# Patient Record
Sex: Female | Born: 1980 | Race: White | Hispanic: No | Smoking: Former smoker
Health system: Southern US, Community
[De-identification: ages and names within clinical notes are randomized; demographics above are authoritative.]

## PROBLEM LIST (undated history)

## (undated) DIAGNOSIS — L409 Psoriasis, unspecified: Secondary | ICD-10-CM

## (undated) DIAGNOSIS — M1990 Unspecified osteoarthritis, unspecified site: Secondary | ICD-10-CM

## (undated) DIAGNOSIS — G43909 Migraine, unspecified, not intractable, without status migrainosus: Secondary | ICD-10-CM

## (undated) HISTORY — PX: HERNIA REPAIR: SHX51

## (undated) HISTORY — PX: TUBAL LIGATION: SHX77

---
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
## 2000-04-22 ENCOUNTER — Inpatient Hospital Stay (HOSPITAL_COMMUNITY): Admission: AD | Admit: 2000-04-22 | Discharge: 2000-04-22 | Payer: Self-pay | Admitting: Obstetrics & Gynecology

## 2002-01-03 ENCOUNTER — Ambulatory Visit (HOSPITAL_COMMUNITY): Admission: EM | Admit: 2002-01-03 | Discharge: 2002-01-03 | Payer: Self-pay | Admitting: *Deleted

## 2004-07-26 ENCOUNTER — Emergency Department (HOSPITAL_COMMUNITY): Admission: EM | Admit: 2004-07-26 | Discharge: 2004-07-26 | Payer: Self-pay | Admitting: Emergency Medicine

## 2006-11-25 ENCOUNTER — Inpatient Hospital Stay (HOSPITAL_COMMUNITY): Admission: AD | Admit: 2006-11-25 | Discharge: 2006-11-28 | Payer: Self-pay | Admitting: Obstetrics & Gynecology

## 2007-02-10 ENCOUNTER — Ambulatory Visit (HOSPITAL_COMMUNITY): Admission: RE | Admit: 2007-02-10 | Discharge: 2007-02-10 | Payer: Self-pay | Admitting: Obstetrics and Gynecology

## 2008-11-04 ENCOUNTER — Inpatient Hospital Stay (HOSPITAL_COMMUNITY): Admission: AD | Admit: 2008-11-04 | Discharge: 2008-11-04 | Payer: Self-pay | Admitting: Obstetrics & Gynecology

## 2008-12-19 ENCOUNTER — Ambulatory Visit: Payer: Self-pay | Admitting: Obstetrics and Gynecology

## 2008-12-19 ENCOUNTER — Inpatient Hospital Stay (HOSPITAL_COMMUNITY): Admission: AD | Admit: 2008-12-19 | Discharge: 2008-12-19 | Payer: Self-pay | Admitting: Obstetrics and Gynecology

## 2010-04-29 LAB — URINALYSIS, ROUTINE W REFLEX MICROSCOPIC
Bilirubin Urine: NEGATIVE
Glucose, UA: NEGATIVE mg/dL
Ketones, ur: 15 mg/dL — AB
Nitrite: POSITIVE — AB
Protein, ur: NEGATIVE mg/dL
Specific Gravity, Urine: 1.025 (ref 1.005–1.030)
Urobilinogen, UA: 0.2 mg/dL (ref 0.0–1.0)
pH: 5 (ref 5.0–8.0)

## 2010-04-29 LAB — URINE CULTURE: Colony Count: >=100000

## 2010-04-29 LAB — URINE MICROSCOPIC-ADD ON

## 2010-04-29 LAB — CREATININE, SERUM
Creatinine, Ser: 0.97 mg/dL (ref 0.4–1.2)
GFR calc Af Amer: >60 mL/min (ref >60)
GFR calc non Af Amer: >60 mL/min (ref >60)

## 2010-04-30 LAB — WET PREP, GENITAL: Yeast Wet Prep HPF POC: NONE SEEN

## 2010-04-30 LAB — URINALYSIS, ROUTINE W REFLEX MICROSCOPIC
Bilirubin Urine: NEGATIVE
Glucose, UA: NEGATIVE mg/dL
Protein, ur: NEGATIVE mg/dL

## 2010-04-30 LAB — URINE MICROSCOPIC-ADD ON

## 2010-04-30 LAB — POCT PREGNANCY, URINE: Preg Test, Ur: NEGATIVE

## 2010-06-09 NOTE — Op Note (Signed)
Kristin Norman, Kristin Norman                ACCOUNT NO.:  [id number]   MEDICAL RECORD NO.:  [id number]          PATIENT TYPE:  AMB   LOCATION:  SDC                           FACILITY:  [facility]   PHYSICIAN:  Zelphia Cairo, MD    DATE OF BIRTH:  [birth year]   DATE OF PROCEDURE:  02/10/2007  DATE OF DISCHARGE:                               OPERATIVE REPORT   PREOPERATIVE DIAGNOSES:  1. Multiparous.  2. Desires permanent sterilization.   POSTOPERATIVE DIAGNOSES:  1. Multiparous.  2. Desires permanent sterilization.   PROCEDURE:  Laparoscopic bilateral tubal ligation with Filshie clips.   SURGEON:  Renaldo Fiddler.   ANESTHESIA:  General.   COMPLICATIONS:  None.   CONDITION:  Stable and extubated to recovery room.   PROCEDURE:  The patient was taken to the operating room where general  anesthesia was found to be adequate.  She was placed in the dorsal  lithotomy position using Allen stirrups.  She was prepped and draped in  sterile fashion and a catheter was used to drain her bladder for  approximately 50 mL of clear urine.  A bivalve speculum was placed in  the vagina, and a single-tooth tenaculum placed on the anterior lip of  the cervix.  A Hulka clamp was placed through the cervix and on the  anterior lip.  The single-tooth tenaculum and speculum were then removed  from the vagina and our attention was turned to the abdomen.   A small infraumbilical skin incision was made with a scalpel, and an 11-  mm optical trocar was inserted through this incision under direct  visualization.  Once intraperitoneal placement was confirmed, CO2 was  turned on and the abdomen and pelvis were insufflated.  A survey was  performed.  Bilateral fallopian tubes, ovaries and uterus appeared  normal.  There was a mild amount of scar tissue in the right upper  quadrant above the liver.  Otherwise, the abdomen and pelvis appeared  normal.  Filshie clips were loaded, and bilateral fallopian tubes were  obstructed  using Filshie clips.  There were no complications.  Laparoscope and trocar was then removed from the abdomen, and the  fascia of the infraumbilical skin incision was closed with Vicryl.  The  skin was closed with 3-0 Vicryl.  The patient tolerated the procedure  well.  Marcaine was then used to provide local anesthesia.  The patient  tolerated the procedure well.  Sponge, lap, needle and instrument counts  were correct x2      Zelphia Cairo, MD  Electronically Signed     GA/MEDQ  D:  02/10/2007  T:  02/10/2007  Job:  [id number]

## 2010-10-16 LAB — CBC
HCT: 41.8
MCV: 87.2
RBC: 4.79
WBC: 11.5 — ABNORMAL HIGH

## 2010-11-03 LAB — CBC
HCT: 38.5
Hemoglobin: 13.3
MCHC: 34.5
MCHC: 34.6
MCV: 89.1
Platelets: 239
RDW: 13.2

## 2013-01-26 ENCOUNTER — Emergency Department (HOSPITAL_COMMUNITY)
Admission: EM | Admit: 2013-01-26 | Discharge: 2013-01-26 | Disposition: A | Discharge status: Home / Self Care | Payer: Medicaid Other | Attending: Emergency Medicine | Admitting: Emergency Medicine

## 2013-01-26 ENCOUNTER — Encounter (HOSPITAL_COMMUNITY): Payer: Self-pay | Admitting: Emergency Medicine

## 2013-01-26 ENCOUNTER — Emergency Department (HOSPITAL_COMMUNITY): Payer: Medicaid Other

## 2013-01-26 DIAGNOSIS — IMO0002 Reserved for concepts with insufficient information to code with codable children: Secondary | ICD-10-CM | POA: Insufficient documentation

## 2013-01-26 DIAGNOSIS — K259 Gastric ulcer, unspecified as acute or chronic, without hemorrhage or perforation: Secondary | ICD-10-CM | POA: Insufficient documentation

## 2013-01-26 DIAGNOSIS — R059 Cough, unspecified: Secondary | ICD-10-CM

## 2013-01-26 DIAGNOSIS — R29898 Other symptoms and signs involving the musculoskeletal system: Secondary | ICD-10-CM | POA: Insufficient documentation

## 2013-01-26 DIAGNOSIS — Z79899 Other long term (current) drug therapy: Secondary | ICD-10-CM | POA: Insufficient documentation

## 2013-01-26 DIAGNOSIS — F17200 Nicotine dependence, unspecified, uncomplicated: Secondary | ICD-10-CM | POA: Insufficient documentation

## 2013-01-26 DIAGNOSIS — J029 Acute pharyngitis, unspecified: Secondary | ICD-10-CM | POA: Insufficient documentation

## 2013-01-26 DIAGNOSIS — B9789 Other viral agents as the cause of diseases classified elsewhere: Secondary | ICD-10-CM | POA: Insufficient documentation

## 2013-01-26 DIAGNOSIS — R05 Cough: Secondary | ICD-10-CM

## 2013-01-26 DIAGNOSIS — B349 Viral infection, unspecified: Secondary | ICD-10-CM

## 2013-01-26 HISTORY — DX: Unspecified osteoarthritis, unspecified site: M19.90

## 2013-01-26 MED ORDER — HYDROCOD POLST-CHLORPHEN POLST 10-8 MG/5ML PO LQCR: 5.0000 mL | Freq: Every evening | ORAL | Status: DC | PRN

## 2013-01-26 NOTE — ED Notes (Signed)
C/o chest congestion worse when she coughs

## 2013-01-26 NOTE — ED Provider Notes (Signed)
CSN: [id number]     Arrival date & time 01/26/13  1827 History   First MD Initiated Contact with Patient 01/26/13 2257     Chief Complaint  Patient presents with  . multiple complaints    (Consider location/radiation/quality/duration/timing/severity/associated sxs/prior Treatment) HPI 33 year old female presents to emergency room with complaint of 2 days of cough.  No fevers.  No sick contacts.  Patient is a 20 year smoker, recently switched to vapor cigarettes.  She reports the cough is raspy, productive of dark brown to black sputum.  She has sore throat.  Due to 2 persistent cough.  She's been taking Mucinex with some improvement.  She's tried hot tea with honey.  Patient reports she has problems with stomach ulcers, and has been avoiding ibuprofen, but has been taking Tylenol. Past Medical History  Diagnosis Date  . Arthritis    History reviewed. No pertinent past surgical history. No family history on file. History  Substance Use Topics  . Smoking status: Current Every Day Smoker  . Smokeless tobacco: Not on file  . Alcohol Use: Yes   OB History   Grav Para Term Preterm Abortions TAB SAB Ect Mult Living                 Review of Systems  See History of Present Illness; otherwise all other systems are reviewed and negative Allergies  Eggs or egg-derived products; Sulfa antibiotics; Tamiflu; and Tomato  Home Medications   Current Outpatient Rx  Name  Route  Sig  Dispense  Refill  . clonazePAM (KLONOPIN) 0.5 MG tablet   Oral   Take 0.5 mg by mouth at bedtime as needed (migraine).         . fluocinonide (LIDEX) 0.05 % external solution   Topical   Apply 1 application topically daily as needed (itchy scalp). About once a week         . fluocinonide cream (LIDEX) 0.05 %   Topical   Apply 1 application topically 3 (three) times daily.         Marland Kitchen FOLIC ACID PO   Oral   Take 1 mg by mouth at bedtime. 2 500 mcg tablets         . guaiFENesin (MUCINEX) 600 MG 12 hr  tablet   Oral   Take 600 mg by mouth every 8 (eight) hours as needed for cough (congestion).         Marland Kitchen ibuprofen (ADVIL,MOTRIN) 200 MG tablet   Oral   Take 800 mg by mouth every 4 (four) hours as needed (pain).         . MedroxyPROGESTERone Acetate (DEPO-PROVERA IM)   Intramuscular   Inject 1 application into the muscle every 3 (three) months. Last injection mid October, 2014         . methotrexate (RHEUMATREX) 2.5 MG tablet   Oral   Take 12.5 mg by mouth once a week. Caution:Chemotherapy. Protect from light. Take on Thursday evening at 8pm         . PRESCRIPTION MEDICATION   Topical   Apply 1 application topically 3 (three) times daily. Cream for psoriasis         . tiZANidine (ZANAFLEX) 4 MG tablet   Oral   Take 4 mg by mouth every 6 (six) hours as needed (migraines).          BP 106/64  Pulse 70  Temp(Src) 97.8 F (36.6 C) (Oral)  Resp 16  Ht 5\' 5"  (1.651 m)  Wt 123 lb 8 oz (56.019 kg)  BMI 20.55 kg/m2  SpO2 99% Physical Exam  Nursing note and vitals reviewed. Constitutional: She is oriented to person, place, and time. She appears well-developed and well-nourished.  HENT:  Head: Normocephalic and atraumatic.  Nose: Nose normal.  Mouth/Throat: Oropharynx is clear and moist.  Eyes: Conjunctivae and EOM are normal. Pupils are equal, round, and reactive to light.  Neck: Normal range of motion. Neck supple. No JVD present. No tracheal deviation present. No thyromegaly present.  Cardiovascular: Normal rate, regular rhythm, normal heart sounds and intact distal pulses.  Exam reveals no gallop and no friction rub.   No murmur heard. Pulmonary/Chest: Effort normal and breath sounds normal. No stridor. No respiratory distress. She has no wheezes. She has no rales. She exhibits no tenderness.  Cough noted  Abdominal: Soft. Bowel sounds are normal. She exhibits no distension and no mass. There is no tenderness. There is no rebound and no guarding.  Musculoskeletal:  Normal range of motion. She exhibits no edema and no tenderness.  Lymphadenopathy:    She has no cervical adenopathy.  Neurological: She is alert and oriented to person, place, and time. She exhibits normal muscle tone. Coordination normal.  Skin: Skin is warm and dry. No rash noted. No erythema. No pallor.  Psychiatric: She has a normal mood and affect. Her behavior is normal. Judgment and thought content normal.    ED Course  Procedures (including critical care time) Labs Review Labs Reviewed - No data to display Imaging Review Dg Chest 2 View  01/26/2013   CLINICAL DATA:  Chest pain and cough.  EXAM: CHEST  2 VIEW  COMPARISON:  None.  FINDINGS: The heart size and mediastinal contours are within normal limits. Both lungs are clear. The visualized skeletal structures are unremarkable.  IMPRESSION: No active cardiopulmonary disease.   Electronically Signed   By: Loralie ChampagneMark  Gallerani M.D.   On: 01/26/2013 20:31    EKG Interpretation   None       MDM   1. Cough   2. Viral infection    33 year old female with 2 days of cough.  Appears to be upper respiratory viral infection/early bronchitis.  Chest x-ray is negative.  Patient advised supportive care only at this time.  We'll prescribe Tussionex for nighttime use.   Olivia Mackielga M Miray Mancino, MD 01/26/13 [phone]

## 2013-01-26 NOTE — ED Notes (Signed)
The pt is c/o a cold sore throat coughing productive cough.  Mild headache she is also c/o body aches and back pain since yesterday.  lmp none depo shots tubal ligation

## 2013-01-26 NOTE — Discharge Instructions (Signed)
Antibiotic Nonuse  Your caregiver felt that the infection or problem was not one that would be helped with an antibiotic. Infections may be caused by viruses or bacteria. Only a caregiver can tell which one of these is the likely cause of an illness. A cold is the most common cause of infection in both adults and children. A cold is a virus. Antibiotic treatment will have no effect on a viral infection. Viruses can lead to many lost days of work caring for sick children and many missed days of school. Children may catch as many as 10 "colds" or "flus" per year during which they can be tearful, cranky, and uncomfortable. The goal of treating a virus is aimed at keeping the ill person comfortable. Antibiotics are medications used to help the body fight bacterial infections. There are relatively few types of bacteria that cause infections but there are hundreds of viruses. While both viruses and bacteria cause infection they are very different types of germs. A viral infection will typically go away by itself within 7 to 10 days. Bacterial infections may spread or get worse without antibiotic treatment. Examples of bacterial infections are:  Sore throats (like strep throat or tonsillitis).  Infection in the lung (pneumonia).  Ear and skin infections. Examples of viral infections are:  Colds or flus.  Most coughs and bronchitis.  Sore throats not caused by Strep.  Runny noses. It is often best not to take an antibiotic when a viral infection is the cause of the problem. Antibiotics can kill off the helpful bacteria that we have inside our body and allow harmful bacteria to start growing. Antibiotics can cause side effects such as allergies, nausea, and diarrhea without helping to improve the symptoms of the viral infection. Additionally, repeated uses of antibiotics can cause bacteria inside of our body to become resistant. That resistance can be passed onto harmful bacterial. The next time you have  an infection it may be harder to treat if antibiotics are used when they are not needed. Not treating with antibiotics allows our own immune system to develop and take care of infections more efficiently. Also, antibiotics will work better for us when they are prescribed for bacterial infections. Treatments for a child that is ill may include:  Give extra fluids throughout the day to stay hydrated.  Get plenty of rest.  Only give your child over-the-counter or prescription medicines for pain, discomfort, or fever as directed by your caregiver.  The use of a cool mist humidifier may help stuffy noses.  Cold medications if suggested by your caregiver. Your caregiver may decide to start you on an antibiotic if:  The problem you were seen for today continues for a longer length of time than expected.  You develop a secondary bacterial infection. SEEK MEDICAL CARE IF:  Fever lasts longer than 5 days.  Symptoms continue to get worse after 5 to 7 days or become severe.  Difficulty in breathing develops.  Signs of dehydration develop (poor drinking, rare urinating, dark colored urine).  Changes in behavior or worsening tiredness (listlessness or lethargy). Document Released: 03/22/2001 Document Revised: 04/05/2011 Document Reviewed: 09/18/2008 [facility]Exit[facility] Patient Information 2014 [place], [place].  Cool Mist Vaporizers Vaporizers may help relieve the symptoms of a cough and cold. They add moisture to the air, which helps mucus to become thinner and less sticky. This makes it easier to breathe and cough up secretions. Cool mist vaporizers do not cause serious burns like hot mist vaporizers ("steamers, humidifiers"). Vaporizers have  not been proved to show they help with colds. You should not use a vaporizer if you are allergic to mold.  HOME CARE INSTRUCTIONS  Follow the package instructions for the vaporizer.  Do not use anything other than distilled water in the vaporizer.  Do not run the  vaporizer all of the time. This can cause mold or bacteria to grow in the vaporizer.  Clean the vaporizer after each time it is used.  Clean and dry the vaporizer well before storing it.  Stop using the vaporizer if worsening respiratory symptoms develop. Document Released: 10/09/2003 Document Revised: 09/13/2012 Document Reviewed: 05/31/2012 [facility] Patient Information 2014 [place], [place].  Cough, Adult  A cough is a reflex that helps clear your throat and airways. It can help heal the body or may be a reaction to an irritated airway. A cough may only last 2 or 3 weeks (acute) or may last more than 8 weeks (chronic).  CAUSES Acute cough:  Viral or bacterial infections. Chronic cough:  Infections.  Allergies.  Asthma.  Post-nasal drip.  Smoking.  Heartburn or acid reflux.  Some medicines.  Chronic lung problems (COPD).  Cancer. SYMPTOMS   Cough.  Fever.  Chest pain.  Increased breathing rate.  High-pitched whistling sound when breathing (wheezing).  Colored mucus that you cough up (sputum). TREATMENT   A bacterial cough may be treated with antibiotic medicine.  A viral cough must run its course and will not respond to antibiotics.  Your caregiver may recommend other treatments if you have a chronic cough. HOME CARE INSTRUCTIONS   Only take over-the-counter or prescription medicines for pain, discomfort, or fever as directed by your caregiver. Use cough suppressants only as directed by your caregiver.  Use a cold steam vaporizer or humidifier in your bedroom or home to help loosen secretions.  Sleep in a semi-upright position if your cough is worse at night.  Rest as needed.  Stop smoking if you smoke. SEEK IMMEDIATE MEDICAL CARE IF:   You have pus in your sputum.  Your cough starts to worsen.  You cannot control your cough with suppressants and are losing sleep.  You begin coughing up blood.  You have difficulty breathing.  You develop  pain which is getting worse or is uncontrolled with medicine.  You have a fever. MAKE SURE YOU:   Understand these instructions.  Will watch your condition.  Will get help right away if you are not doing well or get worse. Document Released: 07/10/2010 Document Revised: 04/05/2011 Document Reviewed: 07/10/2010 [facility] Patient Information 2014 [place], [place].  Viral Infections A viral infection can be caused by different types of viruses.Most viral infections are not serious and resolve on their own. However, some infections may cause severe symptoms and may lead to further complications. SYMPTOMS Viruses can frequently cause:  Minor sore throat.  Aches and pains.  Headaches.  Runny nose.  Different types of rashes.  Watery eyes.  Tiredness.  Cough.  Loss of appetite.  Gastrointestinal infections, resulting in nausea, vomiting, and diarrhea. These symptoms do not respond to antibiotics because the infection is not caused by bacteria. However, you might catch a bacterial infection following the viral infection. This is sometimes called a "superinfection." Symptoms of such a bacterial infection may include:  Worsening sore throat with pus and difficulty swallowing.  Swollen neck glands.  Chills and a high or persistent fever.  Severe headache.  Tenderness over the sinuses.  Persistent overall ill feeling (malaise), muscle aches, and tiredness (fatigue).  Persistent cough.  Yellow, green, or brown mucus production with coughing. HOME CARE INSTRUCTIONS   Only take over-the-counter or prescription medicines for pain, discomfort, diarrhea, or fever as directed by your caregiver.  Drink enough water and fluids to keep your urine clear or pale yellow. Sports drinks can provide valuable electrolytes, sugars, and hydration.  Get plenty of rest and maintain proper nutrition. Soups and broths with crackers or rice are fine. SEEK IMMEDIATE MEDICAL CARE IF:   You  have severe headaches, shortness of breath, chest pain, neck pain, or an unusual rash.  You have uncontrolled vomiting, diarrhea, or you are unable to keep down fluids.  You or your child has an oral temperature above 102 F (38.9 C), not controlled by medicine.  Your baby is older than 3 months with a rectal temperature of 102 F (38.9 C) or higher.  Your baby is 323 months old or younger with a rectal temperature of 100.4 F (38 C) or higher. MAKE SURE YOU:   Understand these instructions.  Will watch your condition.  Will get help right away if you are not doing well or get worse. Document Released: 10/21/2004 Document Revised: 04/05/2011 Document Reviewed: 05/18/2010 [facility] Patient Information 2014 [place], [place].

## 2013-04-17 ENCOUNTER — Encounter (HOSPITAL_BASED_OUTPATIENT_CLINIC_OR_DEPARTMENT_OTHER): Payer: Self-pay | Admitting: Emergency Medicine

## 2013-04-17 ENCOUNTER — Emergency Department (HOSPITAL_BASED_OUTPATIENT_CLINIC_OR_DEPARTMENT_OTHER)
Admission: EM | Admit: 2013-04-17 | Discharge: 2013-04-17 | Disposition: A | Discharge status: Home / Self Care | Payer: Medicaid Other | Attending: Emergency Medicine | Admitting: Emergency Medicine

## 2013-04-17 DIAGNOSIS — J020 Streptococcal pharyngitis: Secondary | ICD-10-CM | POA: Insufficient documentation

## 2013-04-17 DIAGNOSIS — F17200 Nicotine dependence, unspecified, uncomplicated: Secondary | ICD-10-CM | POA: Insufficient documentation

## 2013-04-17 DIAGNOSIS — G43909 Migraine, unspecified, not intractable, without status migrainosus: Secondary | ICD-10-CM | POA: Insufficient documentation

## 2013-04-17 DIAGNOSIS — Z79899 Other long term (current) drug therapy: Secondary | ICD-10-CM | POA: Insufficient documentation

## 2013-04-17 DIAGNOSIS — M129 Arthropathy, unspecified: Secondary | ICD-10-CM | POA: Insufficient documentation

## 2013-04-17 HISTORY — DX: Migraine, unspecified, not intractable, without status migrainosus: G43.909

## 2013-04-17 LAB — RAPID STREP SCREEN (MED CTR MEBANE ONLY): Streptococcus, Group A Screen (Direct): POSITIVE — AB

## 2013-04-17 MED ORDER — CEPHALEXIN 500 MG PO CAPS: 500.0000 mg | ORAL_CAPSULE | Freq: Four times a day (QID) | ORAL | Status: DC

## 2013-04-17 NOTE — Discharge Instructions (Signed)
Keflex as prescribed.  Ibuprofen 600 mg every 6 hours as needed for pain.   Strep Throat Strep throat is an infection of the throat caused by a bacteria named Streptococcus pyogenes. Your caregiver may call the infection streptococcal "tonsillitis" or "pharyngitis" depending on whether there are signs of inflammation in the tonsils or back of the throat. Strep throat is most common in children aged 33 15 years during the cold months of the year, but it can occur in people of any age during any season. This infection is spread from person to person (contagious) through coughing, sneezing, or other close contact. SYMPTOMS   Fever or chills.  Painful, swollen, red tonsils or throat.  Pain or difficulty when swallowing.  White or yellow spots on the tonsils or throat.  Swollen, tender lymph nodes or "glands" of the neck or under the jaw.  Red rash all over the body (rare). DIAGNOSIS  Many different infections can cause the same symptoms. A test must be done to confirm the diagnosis so the right treatment can be given. A "rapid strep test" can help your caregiver make the diagnosis in a few minutes. If this test is not available, a light swab of the infected area can be used for a throat culture test. If a throat culture test is done, results are usually available in a day or two. TREATMENT  Strep throat is treated with antibiotic medicine. HOME CARE INSTRUCTIONS   Gargle with 1 tsp of salt in 1 cup of warm water, 3 4 times per day or as needed for comfort.  Family members who also have a sore throat or fever should be tested for strep throat and treated with antibiotics if they have the strep infection.  Make sure everyone in your household washes their hands well.  Do not share food, drinking cups, or personal items that could cause the infection to spread to others.  You may need to eat a soft food diet until your sore throat gets better.  Drink enough water and fluids to keep your  urine clear or pale yellow. This will help prevent dehydration.  Get plenty of rest.  Stay home from school, daycare, or work until you have been on antibiotics for 24 hours.  Only take over-the-counter or prescription medicines for pain, discomfort, or fever as directed by your caregiver.  If antibiotics are prescribed, take them as directed. Finish them even if you start to feel better. SEEK MEDICAL CARE IF:   The glands in your neck continue to enlarge.  You develop a rash, cough, or earache.  You cough up green, yellow-brown, or bloody sputum.  You have pain or discomfort not controlled by medicines.  Your problems seem to be getting worse rather than better. SEEK IMMEDIATE MEDICAL CARE IF:   You develop any new symptoms such as vomiting, severe headache, stiff or painful neck, chest pain, shortness of breath, or trouble swallowing.  You develop severe throat pain, drooling, or changes in your voice.  You develop swelling of the neck, or the skin on the neck becomes red and tender.  You have a fever.  You develop signs of dehydration, such as fatigue, dry mouth, and decreased urination.  You become increasingly sleepy, or you cannot wake up completely. Document Released: 01/09/2000 Document Revised: 12/29/2011 Document Reviewed: 03/12/2010 [facility]Exit[facility] Patient Information 2014 [place], [place].

## 2013-04-17 NOTE — ED Provider Notes (Signed)
CSN: [id number]     Arrival date & time 04/17/13  0845 History   First MD Initiated Contact with Patient 04/17/13 [phone]     Chief Complaint  Patient presents with  . Sore Throat     (Consider location/radiation/quality/duration/timing/severity/associated sxs/prior Treatment) HPI Comments: Patient with sore throat since last night. It is worse with swallowing. She has not tried any medications. She is here with her daughter who is on a similar fashion.  Patient is a 33 y.o. female presenting with pharyngitis. The history is provided by the patient.  Sore Throat This is a new problem. The current episode started yesterday. The problem occurs constantly. The problem has been rapidly worsening. Pertinent negatives include no abdominal pain and no headaches. The symptoms are aggravated by swallowing. Nothing relieves the symptoms. She has tried nothing for the symptoms. The treatment provided no relief.    Past Medical History  Diagnosis Date  . Arthritis   . Migraines    Past Surgical History  Procedure Laterality Date  . Tubal ligation    . Hernia repair     No family history on file. History  Substance Use Topics  . Smoking status: Current Every Day Smoker  . Smokeless tobacco: Not on file  . Alcohol Use: Yes   OB History   Grav Para Term Preterm Abortions TAB SAB Ect Mult Living                 Review of Systems  Gastrointestinal: Negative for abdominal pain.  Neurological: Negative for headaches.  All other systems reviewed and are negative.      Allergies  Eggs or egg-derived products; Sulfa antibiotics; Tamiflu; and Tomato  Home Medications   Current Outpatient Rx  Name  Route  Sig  Dispense  Refill  . Adalimumab (HUMIRA [place])   Subcutaneous   Inject into the skin.         . chlorpheniramine-HYDROcodone (TUSSIONEX PENNKINETIC ER) 10-8 MG/5ML LQCR   Oral   Take 5 mLs by mouth at bedtime as needed for cough.   140 mL   0   . clonazePAM (KLONOPIN) 0.5 MG  tablet   Oral   Take 0.5 mg by mouth at bedtime as needed (migraine).         . fluocinonide (LIDEX) 0.05 % external solution   Topical   Apply 1 application topically daily as needed (itchy scalp). About once a week         . fluocinonide cream (LIDEX) 0.05 %   Topical   Apply 1 application topically 3 (three) times daily.         Marland Kitchen. FOLIC ACID PO   Oral   Take 1 mg by mouth at bedtime. 2 500 mcg tablets         . guaiFENesin (MUCINEX) 600 MG 12 hr tablet   Oral   Take 600 mg by mouth every 8 (eight) hours as needed for cough (congestion).         Marland Kitchen. ibuprofen (ADVIL,MOTRIN) 200 MG tablet   Oral   Take 800 mg by mouth every 4 (four) hours as needed (pain).         . MedroxyPROGESTERone Acetate (DEPO-PROVERA IM)   Intramuscular   Inject 1 application into the muscle every 3 (three) months. Last injection mid October, 2014         . methotrexate (RHEUMATREX) 2.5 MG tablet   Oral   Take 12.5 mg by mouth once a week. Caution:Chemotherapy. Protect  from light. Take on Thursday evening at 8pm         . PRESCRIPTION MEDICATION   Topical   Apply 1 application topically 3 (three) times daily. Cream for psoriasis         . tiZANidine (ZANAFLEX) 4 MG tablet   Oral   Take 4 mg by mouth every 6 (six) hours as needed (migraines).          BP 134/81  Pulse 88  Temp(Src) 98.7 F (37.1 C) (Oral)  Resp 16  Ht 5\' 5"  (1.651 m)  Wt 125 lb (56.7 kg)  BMI 20.80 kg/m2  SpO2 100% Physical Exam  Nursing note and vitals reviewed. Constitutional: She is oriented to person, place, and time. She appears well-developed and well-nourished. No distress.  HENT:  Head: Normocephalic and atraumatic.  The posterior oropharynx is erythematous with slight exudate bilaterally.  TMs are clear bilaterally.  Neck: Normal range of motion. Neck supple.  Cardiovascular: Normal rate and regular rhythm.  Exam reveals no gallop and no friction rub.   No murmur heard. Pulmonary/Chest:  Effort normal and breath sounds normal. No respiratory distress. She has no wheezes.  Abdominal: Soft. Bowel sounds are normal. She exhibits no distension. There is no tenderness.  Musculoskeletal: Normal range of motion. She exhibits no edema.  Lymphadenopathy:    She has cervical adenopathy.  Neurological: She is alert and oriented to person, place, and time.  Skin: Skin is warm and dry. She is not diaphoretic.    ED Course  Procedures (including critical care time) Labs Review Labs Reviewed - No data to display Imaging Review No results found.   EKG Interpretation None      MDM   Final diagnoses:  None    Strep test is positive. We'll treat with Keflex and when necessary followup.    Geoffery Lyons, MD 04/17/13 1010

## 2013-04-17 NOTE — ED Notes (Signed)
Sore throat since last night.  

## 2013-05-19 ENCOUNTER — Encounter (HOSPITAL_BASED_OUTPATIENT_CLINIC_OR_DEPARTMENT_OTHER): Payer: Self-pay | Admitting: Emergency Medicine

## 2013-05-19 ENCOUNTER — Emergency Department (HOSPITAL_BASED_OUTPATIENT_CLINIC_OR_DEPARTMENT_OTHER)
Admission: EM | Admit: 2013-05-19 | Discharge: 2013-05-19 | Disposition: A | Discharge status: Home / Self Care | Payer: Medicaid Other | Attending: Emergency Medicine | Admitting: Emergency Medicine

## 2013-05-19 DIAGNOSIS — F17200 Nicotine dependence, unspecified, uncomplicated: Secondary | ICD-10-CM | POA: Insufficient documentation

## 2013-05-19 DIAGNOSIS — L299 Pruritus, unspecified: Secondary | ICD-10-CM | POA: Insufficient documentation

## 2013-05-19 DIAGNOSIS — Z79899 Other long term (current) drug therapy: Secondary | ICD-10-CM | POA: Insufficient documentation

## 2013-05-19 DIAGNOSIS — R21 Rash and other nonspecific skin eruption: Secondary | ICD-10-CM | POA: Insufficient documentation

## 2013-05-19 DIAGNOSIS — G43909 Migraine, unspecified, not intractable, without status migrainosus: Secondary | ICD-10-CM | POA: Insufficient documentation

## 2013-05-19 DIAGNOSIS — M129 Arthropathy, unspecified: Secondary | ICD-10-CM | POA: Insufficient documentation

## 2013-05-19 HISTORY — DX: Psoriasis, unspecified: L40.9

## 2013-05-19 MED ORDER — DEXAMETHASONE SODIUM PHOSPHATE 10 MG/ML IJ SOLN
INTRAMUSCULAR | Status: AC
  Administered 2013-05-19: 10 mg via INTRAVENOUS
  Filled 2013-05-19: qty 1

## 2013-05-19 MED ORDER — PREDNISONE 20 MG PO TABS: ORAL_TABLET | ORAL | Status: DC

## 2013-05-19 MED ORDER — HYDROXYZINE HCL 25 MG PO TABS: 25.0000 mg | ORAL_TABLET | Freq: Four times a day (QID) | ORAL | Status: DC

## 2013-05-19 MED ORDER — DEXAMETHASONE SODIUM PHOSPHATE 10 MG/ML IJ SOLN
10.0000 mg | Freq: Once | INTRAMUSCULAR | Status: AC
Start: 2013-05-19 — End: 2013-05-19
  Administered 2013-05-19: 10 mg via INTRAVENOUS

## 2013-05-19 NOTE — Discharge Instructions (Signed)

## 2013-05-19 NOTE — ED Provider Notes (Signed)
CSN: [id number]     Arrival date & time 05/19/13  1801 History  This chart was scribed for Rolan BuccoMelanie Shariece Viveiros, MD by Beverly MilchJ Harrison Collins, ED Scribe. This patient was seen in room MH03/MH03 and the patient's care was started at 6:37 PM.    Chief Complaint  Patient presents with  . Rash      The history is provided by the patient. No language interpreter was used.  HPI Comments: Kristin Norman is a 33 y.o. female who presents to the Emergency Department complaining of a generalized rash that began 8 days ago. She states it started on her stomach on the first day, but went away the second day before returning the third. Pt reports it started light with burning and itchiness.  Pt states the rash is very itchy and between her toes, on her back, extremities, abdomen and generally all over. She states she took benadryl with no results. Pt has a h/o psoriasis for which she sees a rheumatologist.    Past Medical History  Diagnosis Date  . Arthritis   . Migraines   . Psoriasis     Past Surgical History  Procedure Laterality Date  . Tubal ligation    . Hernia repair      No family history on file. History  Substance Use Topics  . Smoking status: Current Every Day Smoker  . Smokeless tobacco: Never Used  . Alcohol Use: Yes     Comment: seldom    OB History   Grav Para Term Preterm Abortions TAB SAB Ect Mult Living                  Review of Systems  Constitutional: Negative for fever, chills, diaphoresis and fatigue.  HENT: Negative for congestion, rhinorrhea and sneezing.   Eyes: Negative.   Respiratory: Negative for cough, chest tightness and shortness of breath.   Cardiovascular: Negative for chest pain and leg swelling.  Gastrointestinal: Negative for nausea, vomiting, abdominal pain, diarrhea and blood in stool.  Genitourinary: Negative for frequency, hematuria, flank pain and difficulty urinating.  Musculoskeletal: Negative for arthralgias and back pain.  Skin: Positive for  rash.  Neurological: Negative for dizziness, speech difficulty, weakness, numbness and headaches.      Allergies  Eggs or egg-derived products; Sulfa antibiotics; Tamiflu; and Tomato  Home Medications   Prior to Admission medications   Medication Sig Start Date End Date Taking? Authorizing Provider  clonazePAM (KLONOPIN) 0.5 MG tablet Take 0.5 mg by mouth at bedtime as needed (migraine).   Yes Historical Provider, MD  Etanercept (ENBREL [place]) Inject into the skin.   Yes Historical Provider, MD  fluconazole (DIFLUCAN) 150 MG tablet Take 150 mg by mouth daily.   Yes Historical Provider, MD  fluocinonide (LIDEX) 0.05 % external solution Apply 1 application topically daily as needed (itchy scalp). About once a week   Yes Historical Provider, MD  fluocinonide cream (LIDEX) 0.05 % Apply 1 application topically 3 (three) times daily.   Yes Historical Provider, MD  MedroxyPROGESTERone Acetate (DEPO-PROVERA IM) Inject 1 application into the muscle every 3 (three) months. Last injection mid October, 2014   Yes Historical Provider, MD  tiZANidine (ZANAFLEX) 4 MG tablet Take 4 mg by mouth every 6 (six) hours as needed (migraines).   Yes Historical Provider, MD  Adalimumab (HUMIRA [place]) Inject into the skin.    Historical Provider, MD  cephALEXin (KEFLEX) 500 MG capsule Take 1 capsule (500 mg total) by mouth 4 (four) times daily. 04/17/13  Geoffery Lyonsouglas Delo, MD  chlorpheniramine-HYDROcodone [facility](TUSSIONEX PENNKINETIC ER) 10-8 MG/5ML LQCR Take 5 mLs by mouth at bedtime as needed for cough. 01/26/13   Olivia Mackielga M Otter, MD  FOLIC ACID PO Take 1 mg by mouth at bedtime. 2 500 mcg tablets    Historical Provider, MD  guaiFENesin (MUCINEX) 600 MG 12 hr tablet Take 600 mg by mouth every 8 (eight) hours as needed for cough (congestion).    Historical Provider, MD  ibuprofen (ADVIL,MOTRIN) 200 MG tablet Take 800 mg by mouth every 4 (four) hours as needed (pain).    Historical Provider, MD  methotrexate (RHEUMATREX) 2.5 MG tablet  Take 12.5 mg by mouth once a week. Caution:Chemotherapy. Protect from light. Take on Thursday evening at 8pm    Historical Provider, MD  PRESCRIPTION MEDICATION Apply 1 application topically 3 (three) times daily. Cream for psoriasis    Historical Provider, MD   Triage Vitals: BP 128/74  Pulse 98  Temp(Src) 98.1 F (36.7 C) (Oral)  Resp 20  Ht 5\' 5"  (1.651 m)  Wt 115 lb (52.164 kg)  BMI 19.14 kg/m2  SpO2 98%  Physical Exam  Nursing note and vitals reviewed. Constitutional: She is oriented to person, place, and time. She appears well-developed and well-nourished.  HENT:  Head: Normocephalic and atraumatic.  Eyes: Pupils are equal, round, and reactive to light.  Neck: Normal range of motion. Neck supple.  Cardiovascular: Normal rate, regular rhythm and normal heart sounds.   Pulmonary/Chest: Effort normal and breath sounds normal. No respiratory distress. She has no wheezes. She has no rales. She exhibits no tenderness.  Abdominal: Soft. Bowel sounds are normal. There is no tenderness. There is no rebound and no guarding.  Musculoskeletal: Normal range of motion. She exhibits no edema.  Lymphadenopathy:    She has no cervical adenopathy.  Neurological: She is alert and oriented to person, place, and time.  Skin: Skin is warm and dry. Rash noted.  Diffuse maculopapular rash, scaly in places.  Partially blanching.  No petechiae, no purpura, no vesicles.  Psychiatric: She has a normal mood and affect.    ED Course  Procedures (including critical care time)  DIAGNOSTIC STUDIES: Oxygen Saturation is 98% on RA, normal by my interpretation.    COORDINATION OF CARE: 6:43 PM- Pt advised of plan for treatment and pt agrees.    Labs Review Labs Reviewed - No data to display  Imaging Review No results found.   EKG Interpretation None      MDM   Final diagnoses:  Rash    Pt presents with diffuse rash, pruritic.  No associated fevers, other illness.  Appears allergic.   Will start steroids, atarax.  Pt to f/u with her rheumatologist  I personally performed the services described in this documentation, which was scribed in my presence.  The recorded information has been reviewed and considered.    Rolan BuccoMelanie Threasa Kinch, MD 05/19/13 2351

## 2013-05-19 NOTE — ED Notes (Signed)
Generalized rash x1 week. 

## 2013-07-15 ENCOUNTER — Encounter (HOSPITAL_BASED_OUTPATIENT_CLINIC_OR_DEPARTMENT_OTHER): Payer: Self-pay | Admitting: Emergency Medicine

## 2013-07-15 ENCOUNTER — Emergency Department (HOSPITAL_BASED_OUTPATIENT_CLINIC_OR_DEPARTMENT_OTHER)
Admission: EM | Admit: 2013-07-15 | Discharge: 2013-07-15 | Disposition: A | Discharge status: Home / Self Care | Payer: Medicaid Other | Attending: Emergency Medicine | Admitting: Emergency Medicine

## 2013-07-15 DIAGNOSIS — Z79899 Other long term (current) drug therapy: Secondary | ICD-10-CM | POA: Insufficient documentation

## 2013-07-15 DIAGNOSIS — L408 Other psoriasis: Secondary | ICD-10-CM | POA: Insufficient documentation

## 2013-07-15 DIAGNOSIS — Z8679 Personal history of other diseases of the circulatory system: Secondary | ICD-10-CM | POA: Insufficient documentation

## 2013-07-15 DIAGNOSIS — M129 Arthropathy, unspecified: Secondary | ICD-10-CM | POA: Insufficient documentation

## 2013-07-15 DIAGNOSIS — K0889 Other specified disorders of teeth and supporting structures: Secondary | ICD-10-CM

## 2013-07-15 DIAGNOSIS — K089 Disorder of teeth and supporting structures, unspecified: Secondary | ICD-10-CM | POA: Insufficient documentation

## 2013-07-15 DIAGNOSIS — F17200 Nicotine dependence, unspecified, uncomplicated: Secondary | ICD-10-CM | POA: Insufficient documentation

## 2013-07-15 MED ORDER — HYDROCODONE-ACETAMINOPHEN 5-325 MG PO TABS: 1.0000 | ORAL_TABLET | Freq: Four times a day (QID) | ORAL | Status: DC | PRN

## 2013-07-15 MED ORDER — PENICILLIN V POTASSIUM 500 MG PO TABS: 500.0000 mg | ORAL_TABLET | Freq: Three times a day (TID) | ORAL | Status: DC

## 2013-07-15 NOTE — Discharge Instructions (Signed)
Penicillin as prescribed. Hydrocodone as prescribed as needed for pain.  Followup with your dentist in 2 days as scheduled.   Dental Care and Dentist Visits Dental care supports good overall health. Regular dental visits can also help you avoid dental pain, bleeding, infection, and other more serious health problems in the future. It is important to keep the mouth healthy because diseases in the teeth, gums, and other oral tissues can spread to other areas of the body. Some problems, such as diabetes, heart disease, and pre-term labor have been associated with poor oral health.  See your dentist every 6 months. If you experience emergency problems such as a toothache or broken tooth, go to the dentist right away. If you see your dentist regularly, you may catch problems early. It is easier to be treated for problems in the early stages.  WHAT TO EXPECT AT A DENTIST VISIT  Your dentist will look for many common oral health problems and recommend proper treatment. At your regular dental visit, you can expect:  Gentle cleaning of the teeth and gums. This includes scraping and polishing. This helps to remove the sticky substance around the teeth and gums (plaque). Plaque forms in the mouth shortly after eating. Over time, plaque hardens on the teeth as tartar. If tartar is not removed regularly, it can cause problems. Cleaning also helps remove stains.  Periodic X-rays. These pictures of the teeth and supporting bone will help your dentist assess the health of your teeth.  Periodic fluoride treatments. Fluoride is a natural mineral shown to help strengthen teeth. Fluoride treatmentinvolves applying a fluoride gel or varnish to the teeth. It is most commonly done in children.  Examination of the mouth, tongue, jaws, teeth, and gums to look for any oral health problems, such as:  Cavities (dental caries). This is decay on the tooth caused by plaque, sugar, and acid in the mouth. It is best to catch a  cavity when it is small.  Inflammation of the gums caused by plaque buildup (gingivitis).  Problems with the mouth or malformed or misaligned teeth.  Oral cancer or other diseases of the soft tissues or jaws. KEEP YOUR TEETH AND GUMS HEALTHY For healthy teeth and gums, follow these general guidelines as well as your dentist's specific advice:  Have your teeth professionally cleaned at the dentist every 6 months.  Brush twice daily with a fluoride toothpaste.  Floss your teeth daily.  Ask your dentist if you need fluoride supplements, treatments, or fluoride toothpaste.  Eat a healthy diet. Reduce foods and drinks with added sugar.  Avoid smoking. TREATMENT FOR ORAL HEALTH PROBLEMS If you have oral health problems, treatment varies depending on the conditions present in your teeth and gums.  Your caregiver will most likely recommend good oral hygiene at each visit.  For cavities, gingivitis, or other oral health disease, your caregiver will perform a procedure to treat the problem. This is typically done at a separate appointment. Sometimes your caregiver will refer you to another dental specialist for specific tooth problems or for surgery. SEEK IMMEDIATE DENTAL CARE IF:  You have pain, bleeding, or soreness in the gum, tooth, jaw, or mouth area.  A permanent tooth becomes loose or separated from the gum socket.  You experience a blow or injury to the mouth or jaw area. Document Released: 09/23/2010 Document Revised: 04/05/2011 Document Reviewed: 09/23/2010 [facility] Patient Information 2015 [place], [place]. This information is not intended to replace advice given to you by your health care provider.  Make sure you discuss any questions you have with your health care provider. ° °

## 2013-07-15 NOTE — ED Notes (Signed)
Patient c/o R lower jaw shooting pain that started last night. Had dental work 3 weeks ago.Marland Kitchen. Also c/o some hearing problems with R ear.

## 2013-07-15 NOTE — ED Provider Notes (Signed)
CSN: [id number]     Arrival date & time 07/15/13  0803 History   First MD Initiated Contact with Patient 07/15/13 0815     Chief Complaint  Patient presents with  . Dental Pain     (Consider location/radiation/quality/duration/timing/severity/associated sxs/prior Treatment) Patient is a 33 y.o. female presenting with tooth pain. The history is provided by the patient.  Dental Pain Location:  Lower Lower teeth location:  30/RL 1st molar Quality:  Throbbing Severity:  Severe Onset quality:  Gradual Duration:  1 day Timing:  Constant Progression:  Worsening Chronicity:  New Context: recent dental surgery   Context: not abscess   Relieved by:  Nothing Worsened by:  Nothing tried   Past Medical History  Diagnosis Date  . Arthritis   . Migraines   . Psoriasis    Past Surgical History  Procedure Laterality Date  . Tubal ligation    . Hernia repair     No family history on file. History  Substance Use Topics  . Smoking status: Current Every Day Smoker  . Smokeless tobacco: Never Used  . Alcohol Use: Yes     Comment: seldom   OB History   Grav Para Term Preterm Abortions TAB SAB Ect Mult Living                 Review of Systems  All other systems reviewed and are negative.     Allergies  Eggs or egg-derived products; Sulfa antibiotics; Tamiflu; and Tomato  Home Medications   Prior to Admission medications   Medication Sig Start Date End Date Taking? Authorizing Provider  Adalimumab (HUMIRA [place]) Inject into the skin.    Historical Provider, MD  cephALEXin (KEFLEX) 500 MG capsule Take 1 capsule (500 mg total) by mouth 4 (four) times daily. 04/17/13   Geoffery Lyonsouglas Delo, MD  chlorpheniramine-HYDROcodone (TUSSIONEX PENNKINETIC ER) 10-8 MG/5ML LQCR Take 5 mLs by mouth at bedtime as needed for cough. 01/26/13   Olivia Mackielga M Otter, MD  clonazePAM (KLONOPIN) 0.5 MG tablet Take 0.5 mg by mouth at bedtime as needed (migraine).    Historical Provider, MD  Etanercept (ENBREL [place]) Inject  into the skin.    Historical Provider, MD  fluconazole (DIFLUCAN) 150 MG tablet Take 150 mg by mouth daily.    Historical Provider, MD  fluocinonide (LIDEX) 0.05 % external solution Apply 1 application topically daily as needed (itchy scalp). About once a week    Historical Provider, MD  fluocinonide cream (LIDEX) 0.05 % Apply 1 application topically 3 (three) times daily.    Historical Provider, MD  FOLIC ACID PO Take 1 mg by mouth at bedtime. 2 500 mcg tablets    Historical Provider, MD  guaiFENesin (MUCINEX) 600 MG 12 hr tablet Take 600 mg by mouth every 8 (eight) hours as needed for cough (congestion).    Historical Provider, MD  hydrOXYzine (ATARAX/VISTARIL) 25 MG tablet Take 1 tablet (25 mg total) by mouth every 6 (six) hours. 05/19/13   Rolan BuccoMelanie Belfi, MD  ibuprofen (ADVIL,MOTRIN) 200 MG tablet Take 800 mg by mouth every 4 (four) hours as needed (pain).    Historical Provider, MD  MedroxyPROGESTERone Acetate (DEPO-PROVERA IM) Inject 1 application into the muscle every 3 (three) months. Last injection mid October, 2014    Historical Provider, MD  methotrexate (RHEUMATREX) 2.5 MG tablet Take 12.5 mg by mouth once a week. Caution:Chemotherapy. Protect from light. Take on Thursday evening at 8pm    Historical Provider, MD  predniSONE (DELTASONE) 20 MG tablet 3  tabs po day one, then 2 tabs daily x 4 days 05/19/13   Rolan BuccoMelanie Belfi, MD  PRESCRIPTION MEDICATION Apply 1 application topically 3 (three) times daily. Cream for psoriasis    Historical Provider, MD  tiZANidine (ZANAFLEX) 4 MG tablet Take 4 mg by mouth every 6 (six) hours as needed (migraines).    Historical Provider, MD   BP 108/69  Pulse 79  Temp(Src) 98.5 F (36.9 C) (Oral)  Resp 18 Physical Exam  Nursing note and vitals reviewed. Constitutional: She is oriented to person, place, and time. She appears well-developed and well-nourished. No distress.  HENT:  Head: Normocephalic and atraumatic.  Mouth/Throat: Oropharynx is clear and  moist.  Right lower first molar has significant decay and fillings in place. There is no surrounding swelling or evidence for abscess. There is mild gingival inflammation.  Neck: Normal range of motion. Neck supple.  Lymphadenopathy:    She has no cervical adenopathy.  Neurological: She is alert and oriented to person, place, and time.  Skin: Skin is warm and dry. She is not diaphoretic.    ED Course  Procedures (including critical care time) Labs Review Labs Reviewed - No data to display  Imaging Review No results found.   EKG Interpretation None      MDM   Final diagnoses:  None    We'll treat with penicillin and hydrocodone. Patient has a followup appointment with her dentist on Tuesday to discuss further intervention. She's been advised to keep this.    Geoffery Lyonsouglas Delo, MD 07/15/13 0830

## 2013-09-18 ENCOUNTER — Emergency Department (HOSPITAL_BASED_OUTPATIENT_CLINIC_OR_DEPARTMENT_OTHER)
Admission: EM | Admit: 2013-09-18 | Discharge: 2013-09-18 | Disposition: A | Discharge status: Home / Self Care | Payer: Medicaid Other | Attending: Emergency Medicine | Admitting: Emergency Medicine

## 2013-09-18 ENCOUNTER — Encounter (HOSPITAL_BASED_OUTPATIENT_CLINIC_OR_DEPARTMENT_OTHER): Payer: Self-pay | Admitting: Emergency Medicine

## 2013-09-18 DIAGNOSIS — Z792 Long term (current) use of antibiotics: Secondary | ICD-10-CM | POA: Insufficient documentation

## 2013-09-18 DIAGNOSIS — L408 Other psoriasis: Secondary | ICD-10-CM | POA: Diagnosis not present

## 2013-09-18 DIAGNOSIS — Z87891 Personal history of nicotine dependence: Secondary | ICD-10-CM | POA: Diagnosis not present

## 2013-09-18 DIAGNOSIS — K089 Disorder of teeth and supporting structures, unspecified: Secondary | ICD-10-CM | POA: Insufficient documentation

## 2013-09-18 DIAGNOSIS — IMO0002 Reserved for concepts with insufficient information to code with codable children: Secondary | ICD-10-CM | POA: Insufficient documentation

## 2013-09-18 DIAGNOSIS — G43909 Migraine, unspecified, not intractable, without status migrainosus: Secondary | ICD-10-CM | POA: Diagnosis not present

## 2013-09-18 DIAGNOSIS — M129 Arthropathy, unspecified: Secondary | ICD-10-CM | POA: Diagnosis not present

## 2013-09-18 DIAGNOSIS — K08109 Complete loss of teeth, unspecified cause, unspecified class: Secondary | ICD-10-CM | POA: Diagnosis not present

## 2013-09-18 DIAGNOSIS — Z79899 Other long term (current) drug therapy: Secondary | ICD-10-CM | POA: Insufficient documentation

## 2013-09-18 DIAGNOSIS — K0889 Other specified disorders of teeth and supporting structures: Secondary | ICD-10-CM

## 2013-09-18 MED ORDER — HYDROCODONE-ACETAMINOPHEN 5-325 MG PO TABS: 2.0000 | ORAL_TABLET | ORAL | Status: DC | PRN

## 2013-09-18 MED ORDER — PENICILLIN V POTASSIUM 500 MG PO TABS
500.0000 mg | ORAL_TABLET | Freq: Four times a day (QID) | ORAL | Status: DC
Start: 2013-09-18 — End: 2014-02-25

## 2013-09-18 NOTE — Discharge Instructions (Signed)
Follow up with your Dentist, call today.   Dental Pain A tooth ache may be caused by cavities (tooth decay). Cavities expose the nerve of the tooth to air and hot or cold temperatures. It may come from an infection or abscess (also called a boil or furuncle) around your tooth. It is also often caused by dental caries (tooth decay). This causes the pain you are having. DIAGNOSIS  Your caregiver can diagnose this problem by exam. TREATMENT   If caused by an infection, it may be treated with medications which kill germs (antibiotics) and pain medications as prescribed by your caregiver. Take medications as directed.  Only take over-the-counter or prescription medicines for pain, discomfort, or fever as directed by your caregiver.  Whether the tooth ache today is caused by infection or dental disease, you should see your dentist as soon as possible for further care. SEEK MEDICAL CARE IF: The exam and treatment you received today has been provided on an emergency basis only. This is not a substitute for complete medical or dental care. If your problem worsens or new problems (symptoms) appear, and you are unable to meet with your dentist, call or return to this location. SEEK IMMEDIATE MEDICAL CARE IF:   You have a fever.  You develop redness and swelling of your face, jaw, or neck.  You are unable to open your mouth.  You have severe pain uncontrolled by pain medicine. MAKE SURE YOU:   Understand these instructions.  Will watch your condition.  Will get help right away if you are not doing well or get worse. Document Released: 01/11/2005 Document Revised: 04/05/2011 Document Reviewed: 08/30/2007 [facility] Patient Information 2015 [place], [place]. This information is not intended to replace advice given to you by your health care provider. Make sure you discuss any questions you have with your health care provider.

## 2013-09-18 NOTE — ED Notes (Signed)
C/o right lower toothache. States she feels a knot on lower jaw. Onset 2 days ago. Has appointment with Dentist next week.

## 2013-09-18 NOTE — ED Provider Notes (Signed)
CSN: [id number]     Arrival date & time 09/18/13  0757 History   First MD Initiated Contact with Patient 09/18/13 0759     Chief Complaint  Patient presents with  . Dental Pain      HPI  Presents with dental pain. Her right first premolar is painful. She had an extraction of her molars on that side. Has a partial plate. States several months ago her premolar fracture. She had her period 2 weeks ago with a filling.  States it has been painful ever since. She is scheduled to see her dentist again this week. She did not sleep because of discomfort last night. She states she is planning extraction and revision of her partial because of her pain.  Past Medical History  Diagnosis Date  . Arthritis   . Migraines   . Psoriasis    Past Surgical History  Procedure Laterality Date  . Tubal ligation    . Hernia repair     No family history on file. History  Substance Use Topics  . Smoking status: Former Games developer  . Smokeless tobacco: Never Used  . Alcohol Use: Yes     Comment: seldom   OB History   Grav Para Term Preterm Abortions TAB SAB Ect Mult Living                 Review of Systems  Constitutional: Negative for fever, chills, diaphoresis, appetite change and fatigue.  HENT: Positive for dental problem. Negative for mouth sores, sore throat and trouble swallowing.   Eyes: Negative for visual disturbance.  Respiratory: Negative for cough, chest tightness, shortness of breath and wheezing.   Cardiovascular: Negative for chest pain.  Gastrointestinal: Negative for nausea, vomiting, abdominal pain, diarrhea and abdominal distention.  Endocrine: Negative for polydipsia, polyphagia and polyuria.  Genitourinary: Negative for dysuria, frequency and hematuria.  Musculoskeletal: Negative for gait problem.  Skin: Negative for color change, pallor and rash.  Neurological: Negative for dizziness, syncope, light-headedness and headaches.  Hematological: Does not bruise/bleed easily.    Psychiatric/Behavioral: Negative for behavioral problems and confusion.      Allergies  Eggs or egg-derived products; Sulfa antibiotics; Tamiflu; and Tomato  Home Medications   Prior to Admission medications   Medication Sig Start Date End Date Taking? Authorizing Provider  Adalimumab (HUMIRA [place]) Inject into the skin.    Historical Provider, MD  cephALEXin (KEFLEX) 500 MG capsule Take 1 capsule (500 mg total) by mouth 4 (four) times daily. 04/17/13   Geoffery Lyons, MD  chlorpheniramine-HYDROcodone (TUSSIONEX PENNKINETIC ER) 10-8 MG/5ML LQCR Take 5 mLs by mouth at bedtime as needed for cough. 01/26/13   Olivia Mackie, MD  clonazePAM (KLONOPIN) 0.5 MG tablet Take 0.5 mg by mouth at bedtime as needed (migraine).    Historical Provider, MD  Etanercept (ENBREL [place]) Inject into the skin.    Historical Provider, MD  fluconazole (DIFLUCAN) 150 MG tablet Take 150 mg by mouth daily.    Historical Provider, MD  fluocinonide (LIDEX) 0.05 % external solution Apply 1 application topically daily as needed (itchy scalp). About once a week    Historical Provider, MD  fluocinonide cream (LIDEX) 0.05 % Apply 1 application topically 3 (three) times daily.    Historical Provider, MD  FOLIC ACID PO Take 1 mg by mouth at bedtime. 2 500 mcg tablets    Historical Provider, MD  guaiFENesin (MUCINEX) 600 MG 12 hr tablet Take 600 mg by mouth every 8 (eight) hours as needed for cough (  congestion).    Historical Provider, MD  HYDROcodone-acetaminophen (NORCO) 5-325 MG per tablet Take 1-2 tablets by mouth every 6 (six) hours as needed. 07/15/13   Geoffery Lyons, MD  HYDROcodone-acetaminophen (NORCO/VICODIN) 5-325 MG per tablet Take 2 tablets by mouth every 4 (four) hours as needed. 09/18/13   Rolland Porter, MD  hydrOXYzine (ATARAX/VISTARIL) 25 MG tablet Take 1 tablet (25 mg total) by mouth every 6 (six) hours. 05/19/13   Rolan Bucco, MD  ibuprofen (ADVIL,MOTRIN) 200 MG tablet Take 800 mg by mouth every 4 (four) hours as needed  (pain).    Historical Provider, MD  MedroxyPROGESTERone Acetate (DEPO-PROVERA IM) Inject 1 application into the muscle every 3 (three) months. Last injection mid October, 2014    Historical Provider, MD  methotrexate (RHEUMATREX) 2.5 MG tablet Take 12.5 mg by mouth once a week. Caution:Chemotherapy. Protect from light. Take on Thursday evening at 8pm    Historical Provider, MD  penicillin v potassium (VEETID) 500 MG tablet Take 1 tablet (500 mg total) by mouth 3 (three) times daily. 07/15/13   Geoffery Lyons, MD  penicillin v potassium (VEETID) 500 MG tablet Take 1 tablet (500 mg total) by mouth 4 (four) times daily. 09/18/13   Rolland Porter, MD  predniSONE (DELTASONE) 20 MG tablet 3 tabs po day one, then 2 tabs daily x 4 days 05/19/13   Rolan Bucco, MD  PRESCRIPTION MEDICATION Apply 1 application topically 3 (three) times daily. Cream for psoriasis    Historical Provider, MD  tiZANidine (ZANAFLEX) 4 MG tablet Take 4 mg by mouth every 6 (six) hours as needed (migraines).    Historical Provider, MD   BP 134/75  Pulse 72  Temp(Src) 98.5 F (36.9 C) (Oral)  Resp 18  SpO2 97%  LMP 08/18/2013 Physical Exam  HENT:  Mouth/Throat:      ED Course  Procedures (including critical care time) Labs Review Labs Reviewed - No data to display  Imaging Review No results found.   EKG Interpretation None      MDM   Final diagnoses:  Pain, dental    Exam shows a normal-appearing tooth. No obvious abscess on exam. Some induration at the gingival base. I have asked her to followup with her dentist as soon as possible. Penicillin in the meanwhile.    Rolland Porter, MD 09/18/13 [phone]

## 2013-09-20 ENCOUNTER — Emergency Department (HOSPITAL_BASED_OUTPATIENT_CLINIC_OR_DEPARTMENT_OTHER)
Admission: EM | Admit: 2013-09-20 | Discharge: 2013-09-20 | Disposition: A | Discharge status: Home / Self Care | Payer: Medicaid Other | Attending: Emergency Medicine | Admitting: Emergency Medicine

## 2013-09-20 ENCOUNTER — Encounter (HOSPITAL_BASED_OUTPATIENT_CLINIC_OR_DEPARTMENT_OTHER): Payer: Self-pay | Admitting: Emergency Medicine

## 2013-09-20 DIAGNOSIS — Z79899 Other long term (current) drug therapy: Secondary | ICD-10-CM | POA: Insufficient documentation

## 2013-09-20 DIAGNOSIS — Z872 Personal history of diseases of the skin and subcutaneous tissue: Secondary | ICD-10-CM | POA: Diagnosis not present

## 2013-09-20 DIAGNOSIS — G43909 Migraine, unspecified, not intractable, without status migrainosus: Secondary | ICD-10-CM | POA: Diagnosis not present

## 2013-09-20 DIAGNOSIS — IMO0002 Reserved for concepts with insufficient information to code with codable children: Secondary | ICD-10-CM | POA: Insufficient documentation

## 2013-09-20 DIAGNOSIS — Z792 Long term (current) use of antibiotics: Secondary | ICD-10-CM | POA: Insufficient documentation

## 2013-09-20 DIAGNOSIS — K089 Disorder of teeth and supporting structures, unspecified: Secondary | ICD-10-CM | POA: Diagnosis present

## 2013-09-20 DIAGNOSIS — M129 Arthropathy, unspecified: Secondary | ICD-10-CM | POA: Insufficient documentation

## 2013-09-20 DIAGNOSIS — Z87891 Personal history of nicotine dependence: Secondary | ICD-10-CM | POA: Diagnosis not present

## 2013-09-20 DIAGNOSIS — K0889 Other specified disorders of teeth and supporting structures: Secondary | ICD-10-CM

## 2013-09-20 MED ORDER — HYDROCODONE-ACETAMINOPHEN 5-325 MG PO TABS: 2.0000 | ORAL_TABLET | ORAL | Status: DC | PRN

## 2013-09-20 MED ORDER — CLINDAMYCIN HCL 300 MG PO CAPS: ORAL_CAPSULE | ORAL | Status: DC

## 2013-09-20 NOTE — ED Provider Notes (Signed)
CSN: [id number]     Arrival date & time 09/20/13  1737 History   First MD Initiated Contact with Patient 09/20/13 1902     Chief Complaint  Patient presents with  . Dental Pain     (Consider location/radiation/quality/duration/timing/severity/associated sxs/prior Treatment) Patient is a 33 y.o. female presenting with tooth pain. The history is provided by the patient. No language interpreter was used.  Dental Pain Location:  Lower Lower teeth location:  29/RL 2nd bicuspid Quality:  Localized Severity:  Moderate Onset quality:  Gradual Duration:  1 week Timing:  Constant Progression:  Worsening Chronicity:  New Previous work-up:  Dental exam Relieved by:  Nothing Worsened by:  Nothing tried Ineffective treatments:  None tried Risk factors: no alcohol problem     Past Medical History  Diagnosis Date  . Arthritis   . Migraines   . Psoriasis    Past Surgical History  Procedure Laterality Date  . Tubal ligation    . Hernia repair     No family history on file. History  Substance Use Topics  . Smoking status: Former Games developer  . Smokeless tobacco: Never Used  . Alcohol Use: Yes     Comment: seldom   OB History   Grav Para Term Preterm Abortions TAB SAB Ect Mult Living                 Review of Systems  All other systems reviewed and are negative.     Allergies  Eggs or egg-derived products; Sulfa antibiotics; Tamiflu; and Tomato  Home Medications   Prior to Admission medications   Medication Sig Start Date End Date Taking? Authorizing Provider  Adalimumab (HUMIRA [place]) Inject into the skin.    Historical Provider, MD  cephALEXin (KEFLEX) 500 MG capsule Take 1 capsule (500 mg total) by mouth 4 (four) times daily. 04/17/13   Geoffery Lyons, MD  chlorpheniramine-HYDROcodone (TUSSIONEX PENNKINETIC ER) 10-8 MG/5ML LQCR Take 5 mLs by mouth at bedtime as needed for cough. 01/26/13   Olivia Mackie, MD  clonazePAM (KLONOPIN) 0.5 MG tablet Take 0.5 mg by mouth at bedtime as  needed (migraine).    Historical Provider, MD  Etanercept (ENBREL [place]) Inject into the skin.    Historical Provider, MD  fluconazole (DIFLUCAN) 150 MG tablet Take 150 mg by mouth daily.    Historical Provider, MD  fluocinonide (LIDEX) 0.05 % external solution Apply 1 application topically daily as needed (itchy scalp). About once a week    Historical Provider, MD  fluocinonide cream (LIDEX) 0.05 % Apply 1 application topically 3 (three) times daily.    Historical Provider, MD  FOLIC ACID PO Take 1 mg by mouth at bedtime. 2 500 mcg tablets    Historical Provider, MD  guaiFENesin (MUCINEX) 600 MG 12 hr tablet Take 600 mg by mouth every 8 (eight) hours as needed for cough (congestion).    Historical Provider, MD  HYDROcodone-acetaminophen (NORCO) 5-325 MG per tablet Take 1-2 tablets by mouth every 6 (six) hours as needed. 07/15/13   Geoffery Lyons, MD  HYDROcodone-acetaminophen (NORCO/VICODIN) 5-325 MG per tablet Take 2 tablets by mouth every 4 (four) hours as needed. 09/18/13   Rolland Porter, MD  hydrOXYzine (ATARAX/VISTARIL) 25 MG tablet Take 1 tablet (25 mg total) by mouth every 6 (six) hours. 05/19/13   Rolan Bucco, MD  ibuprofen (ADVIL,MOTRIN) 200 MG tablet Take 800 mg by mouth every 4 (four) hours as needed (pain).    Historical Provider, MD  MedroxyPROGESTERone Acetate (DEPO-PROVERA IM) Inject  1 application into the muscle every 3 (three) months. Last injection mid October, 2014    Historical Provider, MD  methotrexate (RHEUMATREX) 2.5 MG tablet Take 12.5 mg by mouth once a week. Caution:Chemotherapy. Protect from light. Take on Thursday evening at 8pm    Historical Provider, MD  penicillin v potassium (VEETID) 500 MG tablet Take 1 tablet (500 mg total) by mouth 3 (three) times daily. 07/15/13   Geoffery Lyons, MD  penicillin v potassium (VEETID) 500 MG tablet Take 1 tablet (500 mg total) by mouth 4 (four) times daily. 09/18/13   Rolland Porter, MD  predniSONE (DELTASONE) 20 MG tablet 3 tabs po day one, then 2  tabs daily x 4 days 05/19/13   Rolan Bucco, MD  PRESCRIPTION MEDICATION Apply 1 application topically 3 (three) times daily. Cream for psoriasis    Historical Provider, MD  tiZANidine (ZANAFLEX) 4 MG tablet Take 4 mg by mouth every 6 (six) hours as needed (migraines).    Historical Provider, MD   BP 131/91  Pulse 80  Temp(Src) 98.5 F (36.9 C) (Oral)  Resp 18  Ht 5' 5.5" (1.664 m)  Wt 115 lb (52.164 kg)  BMI 18.84 kg/m2  SpO2 98%  LMP 08/18/2013 Physical Exam  Nursing note and vitals reviewed. Constitutional: She is oriented to person, place, and time. She appears well-developed and well-nourished.  HENT:  Head: Normocephalic and atraumatic.  Slight swelling right face  Eyes: Conjunctivae and EOM are normal. Pupils are equal, round, and reactive to light.  Neck: Normal range of motion.  Cardiovascular: Normal rate and normal heart sounds.   Pulmonary/Chest: Effort normal.  Abdominal: She exhibits no distension.  Musculoskeletal: Normal range of motion.  Neurological: She is alert and oriented to person, place, and time.  Skin: Skin is warm.  Psychiatric: She has a normal mood and affect.    ED Course  Procedures (including critical care time) Labs Review Labs Reviewed - No data to display  Imaging Review No results found.   EKG Interpretation None      MDM  Pt has a Education officer, community in [place].   Dr. Newman Pies.   Final diagnoses:  Toothache   I will change to clindamycin.   Pt is out of pain medication.   Rx for 10 tablets of hydrocodone     Lonia Skinner [place], [place] 09/20/13 1926

## 2013-09-20 NOTE — ED Notes (Signed)
Dental pain. States she was here 2 days ago for same.

## 2013-09-21 NOTE — ED Provider Notes (Signed)
Medical screening examination/treatment/procedure(s) were performed by non-physician practitioner and as supervising physician I was immediately available for consultation/collaboration.   EKG Interpretation None        Purvis Sheffield, MD 09/21/13 [phone]

## 2014-02-25 ENCOUNTER — Emergency Department (HOSPITAL_BASED_OUTPATIENT_CLINIC_OR_DEPARTMENT_OTHER): Payer: Medicaid Other

## 2014-02-25 ENCOUNTER — Emergency Department (HOSPITAL_BASED_OUTPATIENT_CLINIC_OR_DEPARTMENT_OTHER)
Admission: EM | Admit: 2014-02-25 | Discharge: 2014-02-25 | Disposition: A | Discharge status: Home / Self Care | Payer: Medicaid Other | Attending: Emergency Medicine | Admitting: Emergency Medicine

## 2014-02-25 ENCOUNTER — Encounter (HOSPITAL_BASED_OUTPATIENT_CLINIC_OR_DEPARTMENT_OTHER): Payer: Self-pay | Admitting: *Deleted

## 2014-02-25 DIAGNOSIS — Z872 Personal history of diseases of the skin and subcutaneous tissue: Secondary | ICD-10-CM | POA: Insufficient documentation

## 2014-02-25 DIAGNOSIS — H9209 Otalgia, unspecified ear: Secondary | ICD-10-CM | POA: Diagnosis not present

## 2014-02-25 DIAGNOSIS — Z8679 Personal history of other diseases of the circulatory system: Secondary | ICD-10-CM | POA: Diagnosis not present

## 2014-02-25 DIAGNOSIS — B349 Viral infection, unspecified: Secondary | ICD-10-CM | POA: Diagnosis not present

## 2014-02-25 DIAGNOSIS — R059 Cough, unspecified: Secondary | ICD-10-CM

## 2014-02-25 DIAGNOSIS — Z79899 Other long term (current) drug therapy: Secondary | ICD-10-CM | POA: Diagnosis not present

## 2014-02-25 DIAGNOSIS — Z7952 Long term (current) use of systemic steroids: Secondary | ICD-10-CM | POA: Insufficient documentation

## 2014-02-25 DIAGNOSIS — J029 Acute pharyngitis, unspecified: Secondary | ICD-10-CM | POA: Diagnosis present

## 2014-02-25 DIAGNOSIS — M1990 Unspecified osteoarthritis, unspecified site: Secondary | ICD-10-CM | POA: Diagnosis not present

## 2014-02-25 DIAGNOSIS — Z87891 Personal history of nicotine dependence: Secondary | ICD-10-CM | POA: Insufficient documentation

## 2014-02-25 DIAGNOSIS — J040 Acute laryngitis: Secondary | ICD-10-CM

## 2014-02-25 DIAGNOSIS — R05 Cough: Secondary | ICD-10-CM

## 2014-02-25 MED ORDER — BENZONATATE 100 MG PO CAPS: 100.0000 mg | ORAL_CAPSULE | Freq: Three times a day (TID) | ORAL | Status: DC

## 2014-02-25 MED ORDER — HYDROCOD POLST-CHLORPHEN POLST 10-8 MG/5ML PO LQCR: 5.0000 mL | Freq: Two times a day (BID) | ORAL | Status: DC | PRN

## 2014-02-25 NOTE — ED Provider Notes (Signed)
CSN: [id number]     Arrival date & time 02/25/14  1723 History   First MD Initiated Contact with Patient 02/25/14 1823     Chief Complaint  Patient presents with  . Sore Throat     (Consider location/radiation/quality/duration/timing/severity/associated sxs/prior Treatment) HPI Comments: Patient presents with complaint of sore throat that began yesterday with subsequent cough productive of dark green sputum and loss of voice. Patient has had some fullness in her left ear but no fever. No runny nose. No nausea, vomiting. No chest pain or shortness of breath. The sore throat is worse with coughing. Patient has had some loose to watery stool today.  Patient is a 34 y.o. female presenting with pharyngitis. The history is provided by the patient and medical records.  Sore Throat Associated symptoms include coughing and a sore throat. Pertinent negatives include no abdominal pain, chills, congestion, fatigue, fever, headaches, myalgias, nausea, rash or vomiting.    Past Medical History  Diagnosis Date  . Arthritis   . Migraines   . Psoriasis    Past Surgical History  Procedure Laterality Date  . Tubal ligation    . Hernia repair     No family history on file. History  Substance Use Topics  . Smoking status: Former Games developer  . Smokeless tobacco: Never Used  . Alcohol Use: Yes     Comment: seldom   OB History    No data available     Review of Systems  Constitutional: Negative for fever, chills and fatigue.  HENT: Positive for ear pain, sore throat and voice change. Negative for congestion, rhinorrhea and sinus pressure.   Eyes: Negative for redness.  Respiratory: Positive for cough. Negative for shortness of breath and wheezing.   Gastrointestinal: Positive for diarrhea. Negative for nausea, vomiting and abdominal pain.  Genitourinary: Negative for dysuria.  Musculoskeletal: Negative for myalgias and neck stiffness.  Skin: Negative for rash.  Neurological: Negative for  headaches.  Hematological: Negative for adenopathy.      Allergies  Eggs or egg-derived products; Sulfa antibiotics; Tamiflu; and Tomato  Home Medications   Prior to Admission medications   Medication Sig Start Date End Date Taking? Authorizing Provider  Certolizumab Pegol (CIMZIA [place]) Inject into the skin.   Yes Historical Provider, MD  benzonatate (TESSALON) 100 MG capsule Take 1 capsule (100 mg total) by mouth every 8 (eight) hours. 02/25/14   Renne Crigler, PA-C  chlorpheniramine-HYDROcodone (TUSSIONEX PENNKINETIC ER) 10-8 MG/5ML LQCR Take 5 mLs by mouth every 12 (twelve) hours as needed for cough. 02/25/14   Renne Crigler, PA-C  MedroxyPROGESTERone Acetate (DEPO-PROVERA IM) Inject 1 application into the muscle every 3 (three) months. Last injection mid October, 2014    Historical Provider, MD  methotrexate (RHEUMATREX) 2.5 MG tablet Take 12.5 mg by mouth once a week. Caution:Chemotherapy. Protect from light. Take on Thursday evening at 8pm    Historical Provider, MD   BP 116/77 mmHg  Pulse 78  Temp(Src) 99.3 F (37.4 C) (Oral)  Resp 16  Ht  (1.651 m)  Wt 128 lb 8 oz (58.287 kg)  BMI 21.38 kg/m2  SpO2 100% Physical Exam  Constitutional: She appears well-developed and well-nourished.  HENT:  Head: Normocephalic and atraumatic.  Right Ear: Tympanic membrane, external ear and ear canal normal.  Left Ear: Tympanic membrane, external ear and ear canal normal.  Nose: Nose normal. No mucosal edema or rhinorrhea.  Mouth/Throat: Uvula is midline, oropharynx is clear and moist and mucous membranes are normal. Mucous membranes are  not dry. No oral lesions. No trismus in the jaw. No uvula swelling. No oropharyngeal exudate, posterior oropharyngeal edema, posterior oropharyngeal erythema or tonsillar abscesses.  Hoarse voice.  Eyes: Conjunctivae are normal. Right eye exhibits no discharge. Left eye exhibits no discharge.  Neck: Normal range of motion. Neck supple.  Cardiovascular:  Normal rate, regular rhythm and normal heart sounds.   No murmur heard. Pulmonary/Chest: Effort normal and breath sounds normal. No respiratory distress. She has no wheezes. She has no rales.  Abdominal: Soft. There is no tenderness.  Lymphadenopathy:    She has no cervical adenopathy.  Neurological: She is alert.  Skin: Skin is warm and dry.  Psychiatric: She has a normal mood and affect.  Nursing note and vitals reviewed.   ED Course  Procedures (including critical care time) Labs Review Labs Reviewed - No data to display  Imaging Review Dg Chest 2 View  02/25/2014   CLINICAL DATA:  Cough, sore throat and upper chest pain  EXAM: CHEST  2 VIEW  COMPARISON:  01/26/2013  FINDINGS: The heart size and mediastinal contours are within normal limits. Both lungs are clear. The visualized skeletal structures are unremarkable.  IMPRESSION: No active cardiopulmonary disease.   Electronically Signed   By: Signa Kellaylor  Stroud M.D.   On: 02/25/2014 18:30     EKG Interpretation None       6:48 PM Patient seen and examined. Chest x-ray reviewed.   Vital signs reviewed and are as follows: BP 116/77 mmHg  Pulse 78  Temp(Src) 99.3 F (37.4 C) (Oral)  Resp 16  Ht 5\' 5"  (1.651 m)  Wt 128 lb 8 oz (58.287 kg)  BMI 21.38 kg/m2  SpO2 100%  Will discharge to home with symptomatic management.   Patient counseled on use of narcotic cough medications. Counseled not to combine these medications with others containing tylenol. Urged not to drink alcohol, drive, or perform any other activities that requires focus while taking these medications. The patient verbalizes understanding and agrees with the plan.  Patient counseled on supportive care for viral URI and s/s to return including worsening symptoms, persistent fever, persistent vomiting, or if they have any other concerns. Urged to see PCP if symptoms persist for more than 3 days. Patient verbalizes understanding and agrees with plan.    MDM   Final  diagnoses:  Viral syndrome  Laryngitis  Cough   Patient with sore throat, laryngitis, cough suspicious for viral respiratory infection. No fever. Patient appears well, nontoxic. Chest x-ray performed and is negative. Patient is on Cimzia for psoriatic arthritis. No focal infection to indicate antibiotic at this time.    Renne CriglerJoshua Bracha Frankowski, PA-C 02/25/14 1852  Arby BarretteMarcy Pfeiffer, MD 03/03/14 [phone]

## 2014-02-25 NOTE — ED Notes (Signed)
Sore throat. Cough with dark green sputum. No fever.

## 2014-02-25 NOTE — Discharge Instructions (Signed)
Please read and follow all provided instructions.  Your diagnoses today include:  1. Viral syndrome   2. Laryngitis   3. Cough     You appear to have an upper respiratory infection (URI). An upper respiratory tract infection, or cold, is a viral infection of the air passages leading to the lungs. It should improve gradually after 5-7 days. You may have a lingering cough that lasts for 2- 4 weeks after the infection.  Tests performed today include:  Vital signs. See below for your results today.   Chest x-ray - no signs of pneumonia or other problems  Medications prescribed:   Tessalon Perles - cough suppressant medication   Tussinex - narcotic cough suppressant syrup  You have been prescribed narcotic cough suppressant such as Tussinex: DO NOT drive or perform any activities that require you to be awake and alert because this medicine can make you drowsy.   Take any prescribed medications only as directed. Treatment for your infection is aimed at treating the symptoms. There are no medications, such as antibiotics, that will cure your infection.   Home care instructions:  Follow any educational materials contained in this packet.   Your illness is contagious and can be spread to others, especially during the first 3 or 4 days. It cannot be cured by antibiotics or other medicines. Take basic precautions such as washing your hands often, covering your mouth when you cough or sneeze, and avoiding public places where you could spread your illness to others.   Please continue drinking plenty of fluids.  Use over-the-counter medicines as needed as directed on packaging for symptom relief.  You may also use ibuprofen or tylenol as directed on packaging for pain or fever.  Do not take multiple medicines containing Tylenol or acetaminophen to avoid taking too much of this medication.  Follow-up instructions: Please follow-up with your primary care provider in the next 3 days for further  evaluation of your symptoms if you are not feeling better.   Return instructions:   Please return to the Emergency Department if you experience worsening symptoms.   RETURN IMMEDIATELY IF you develop shortness of breath, confusion or altered mental status, a new rash, become dizzy, faint, or poorly responsive, or are unable to be cared for at home.  Please return if you have persistent vomiting and cannot keep down fluids or develop a fever that is not controlled by tylenol or motrin.    Please return if you have any other emergent concerns.  Additional Information:  Your vital signs today were: BP 116/77 mmHg   Pulse 78   Temp(Src) 99.3 F (37.4 C) (Oral)   Resp 16   Ht 5\' 5"  (1.651 m)   Wt 128 lb 8 oz (58.287 kg)   BMI 21.38 kg/m2   SpO2 100% If your blood pressure (BP) was elevated above 135/85 this visit, please have this repeated by your doctor within one month. --------------

## 2014-06-25 ENCOUNTER — Other Ambulatory Visit: Payer: Self-pay | Admitting: Family

## 2014-06-25 DIAGNOSIS — Z79899 Other long term (current) drug therapy: Secondary | ICD-10-CM

## 2014-06-28 ENCOUNTER — Ambulatory Visit
Admission: RE | Admit: 2014-06-28 | Discharge: 2014-06-28 | Disposition: A | Discharge status: Home / Self Care | Payer: Medicaid Other | Source: Ambulatory Visit | Attending: Family | Admitting: Family

## 2014-06-28 DIAGNOSIS — Z79899 Other long term (current) drug therapy: Secondary | ICD-10-CM

## 2014-07-10 ENCOUNTER — Other Ambulatory Visit: Payer: Self-pay | Admitting: Family

## 2014-07-10 ENCOUNTER — Ambulatory Visit
Admission: RE | Admit: 2014-07-10 | Discharge: 2014-07-10 | Disposition: A | Discharge status: Home / Self Care | Payer: Medicaid Other | Source: Ambulatory Visit | Attending: Family | Admitting: Family

## 2014-07-10 DIAGNOSIS — M5412 Radiculopathy, cervical region: Secondary | ICD-10-CM

## 2016-02-05 IMAGING — CR DG CERVICAL SPINE COMPLETE 4+V
5 series · 5 of 5 positions shown · non-contrast
Comparison: None.

CLINICAL DATA: Cervical radiculopathy.

EXAM:
CERVICAL SPINE  4+ VIEWS

[view not recorded (1 of 5)]
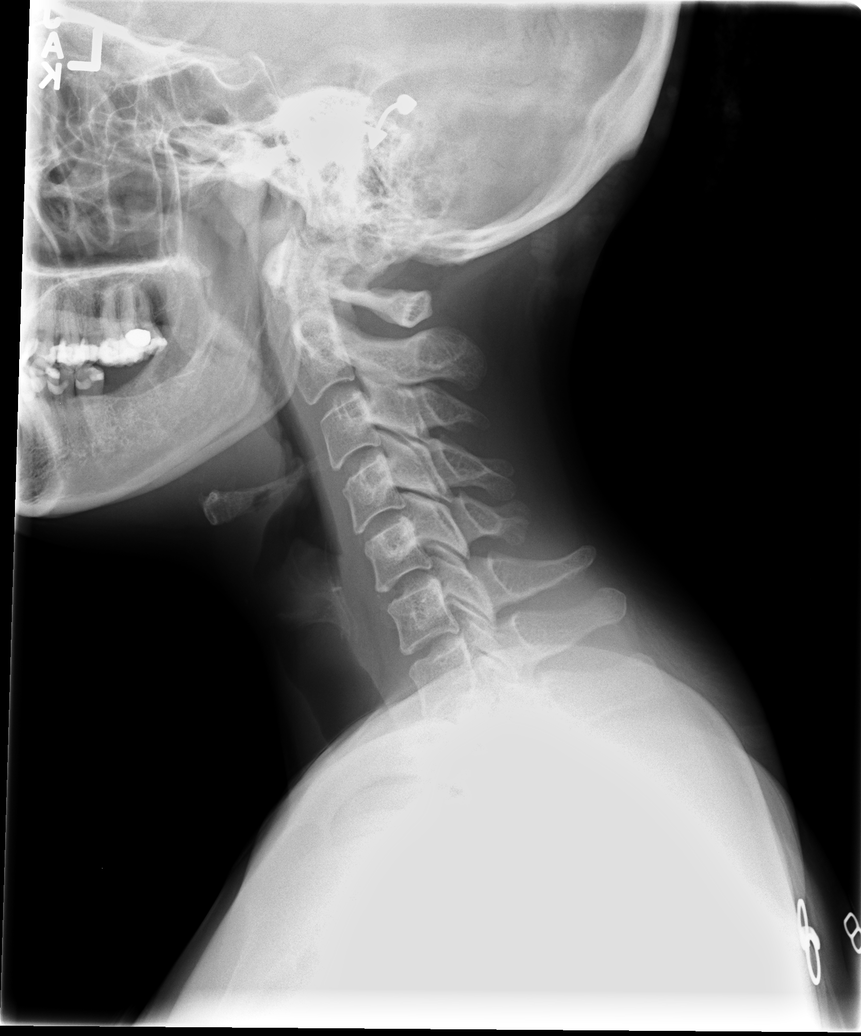

[view not recorded (2 of 5)]
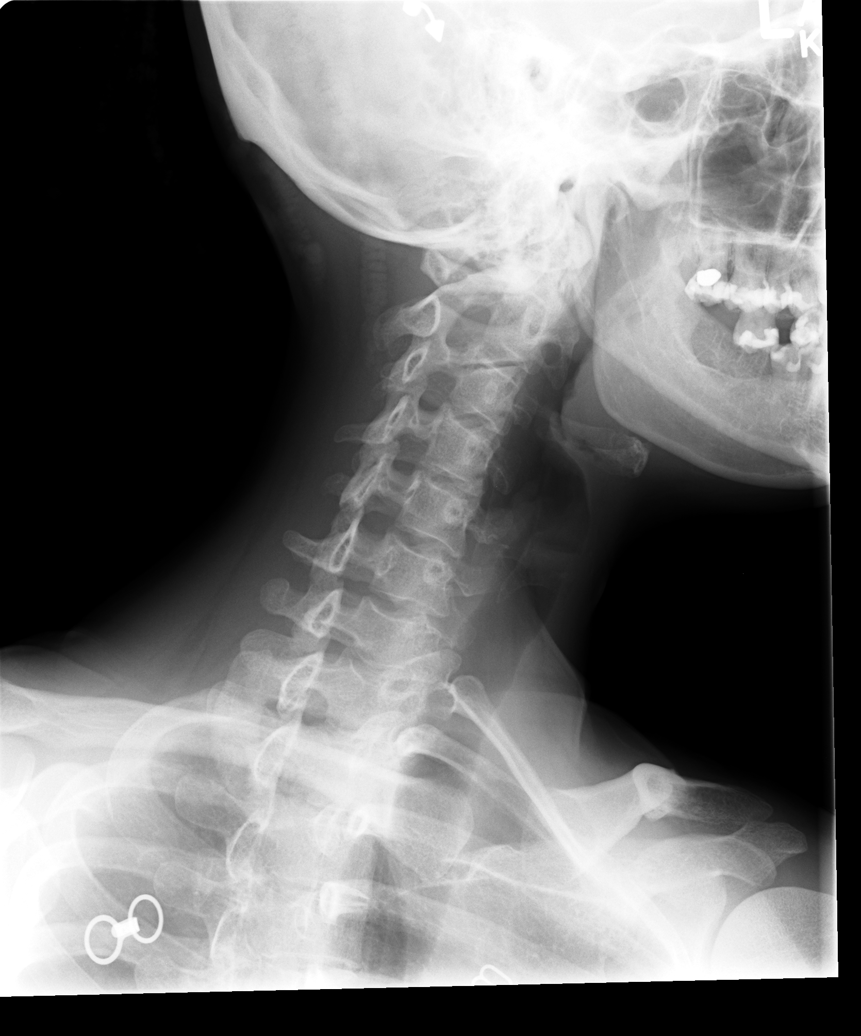

[view not recorded (3 of 5)]
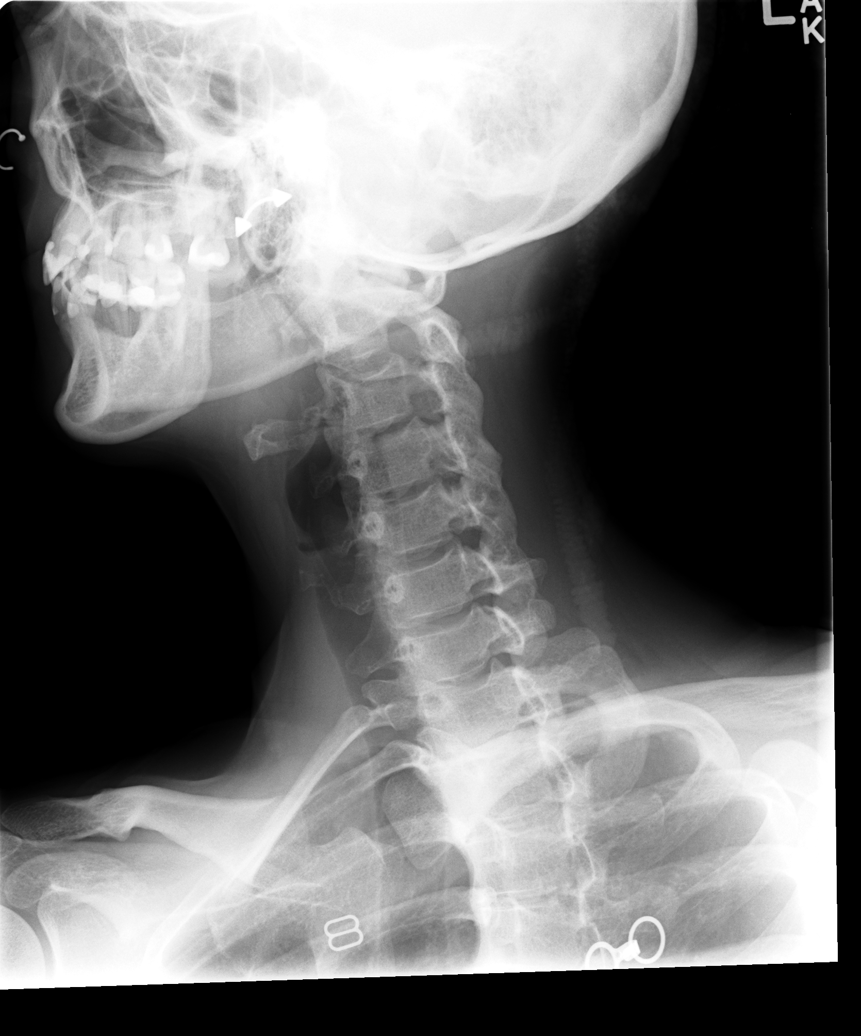

[view not recorded (4 of 5)]
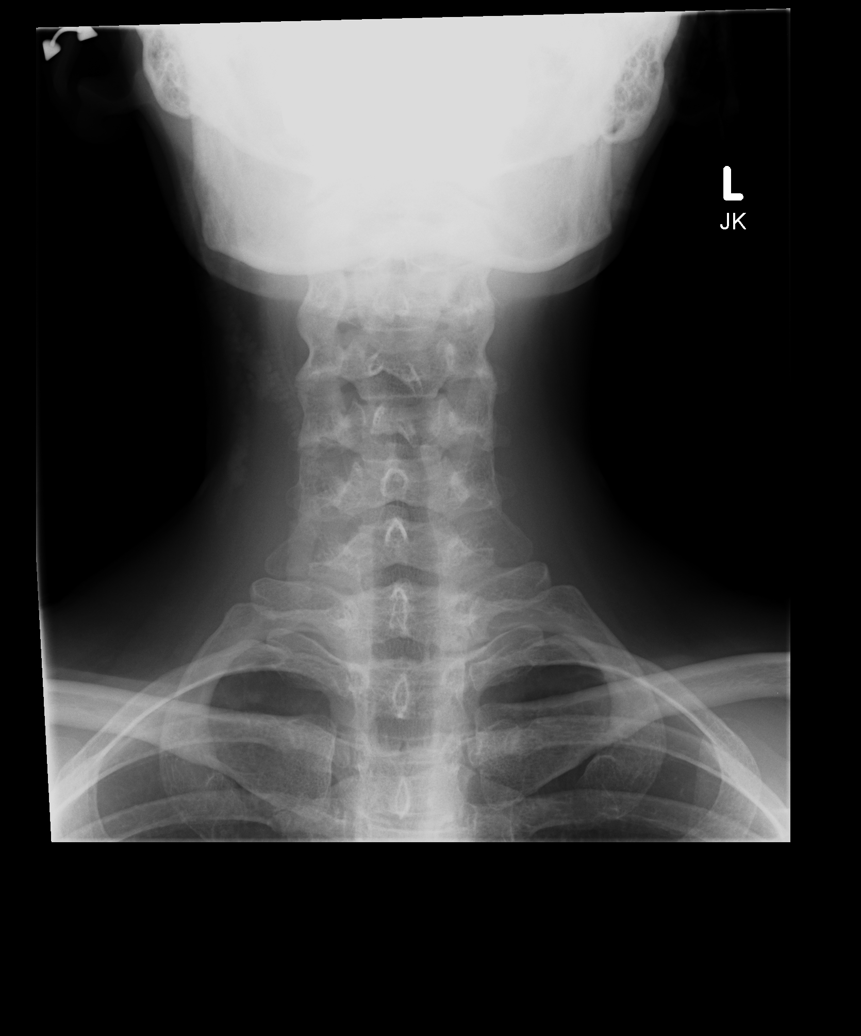

[view not recorded (5 of 5)]
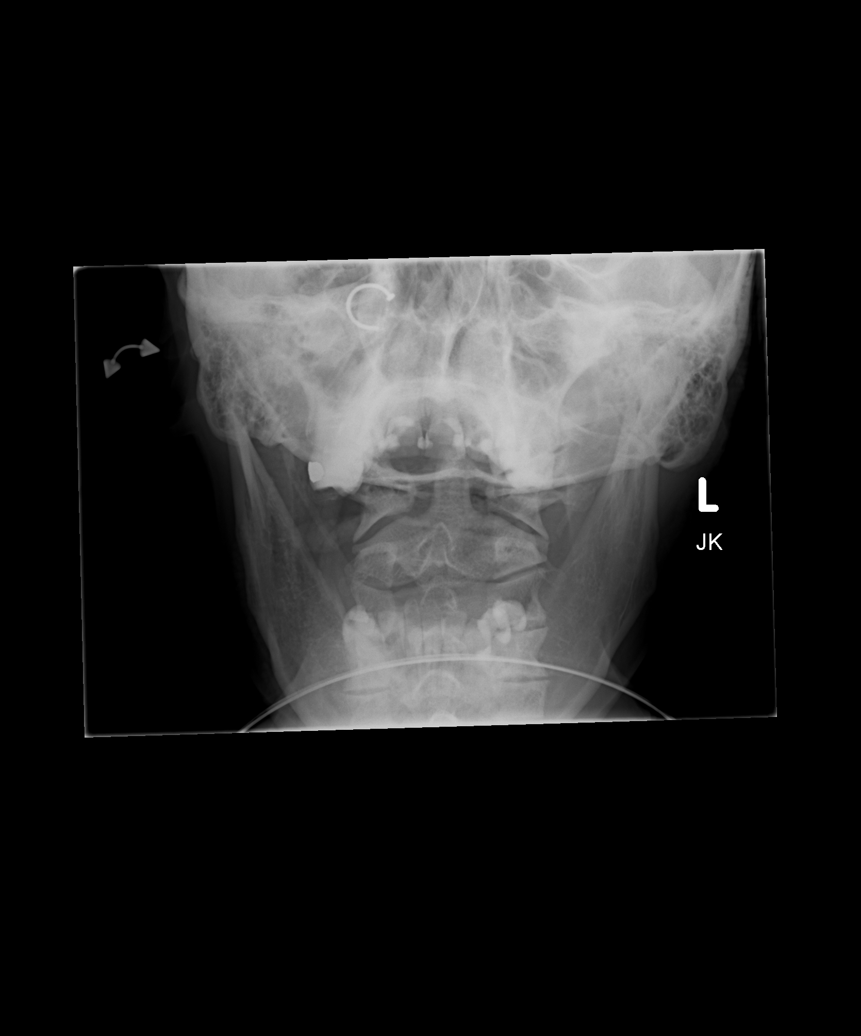

[5 of 5 positions shown; findings below may reference images not displayed]

FINDINGS: Mild straightening cervical spine. No evidence of fracture or
dislocation. Neural foramen are widely patent. Pulmonary apices are
clear.
IMPRESSION: Mild straightening cervical spine. No acute bony abnormality
identified. No evidence of fracture. Neural foramen are widely
patent.

## 2021-03-27 ENCOUNTER — Encounter: Payer: Self-pay | Admitting: Emergency Medicine

## 2021-03-27 ENCOUNTER — Ambulatory Visit
Admission: EM | Admit: 2021-03-27 | Discharge: 2021-03-27 | Disposition: A | Discharge status: Home / Self Care | Payer: Self-pay | Attending: Urgent Care | Admitting: Urgent Care

## 2021-03-27 DIAGNOSIS — H44009 Unspecified purulent endophthalmitis, unspecified eye: Secondary | ICD-10-CM

## 2021-03-27 DIAGNOSIS — J040 Acute laryngitis: Secondary | ICD-10-CM

## 2021-03-27 DIAGNOSIS — B9689 Other specified bacterial agents as the cause of diseases classified elsewhere: Secondary | ICD-10-CM

## 2021-03-27 LAB — POCT RAPID STREP A (OFFICE): Rapid Strep A Screen: NEGATIVE

## 2021-03-27 MED ORDER — ERYTHROMYCIN 5 MG/GM OP OINT: TOPICAL_OINTMENT | OPHTHALMIC | 0 refills | Status: AC

## 2021-03-27 MED ORDER — CLINDAMYCIN HCL 150 MG PO CAPS: 150.0000 mg | ORAL_CAPSULE | Freq: Four times a day (QID) | ORAL | 0 refills | Status: AC

## 2021-03-27 NOTE — ED Provider Notes (Signed)
?[facility] ? ? ? ?CSN: [id number] ?Arrival date & time: 03/27/21  1905 ? ? ?  ? ?History   ?Chief Complaint ?Chief Complaint  ?Patient presents with  ? Eye Problem  ? Hoarse  ? ? ?HPI ?Kristin Norman is a 41 y.o. female.  ? ?41yo female presents today with c/o intermittent left eyelid problem. She states over the past several days she has developed painful, swollen bumps to the lid. States she has been using warm compresses and expressing the drainage with some relief. Has hx of this, but "never got this bad." Also reports nasal drainage, cough and hoarse voice over the past several days. Has hx of RA but not currently on any medications for this. ? ? ?Eye Problem ?Associated symptoms: discharge and itching   ? ?Past Medical History:  ?Diagnosis Date  ? Arthritis   ? Migraines   ? Psoriasis   ? ? ?There are no problems to display for this patient. ? ? ?Past Surgical History:  ?Procedure Laterality Date  ? HERNIA REPAIR    ? TUBAL LIGATION    ? ? ?OB History   ?No obstetric history on file. ?  ? ? ? ?Home Medications   ? ?Prior to Admission medications   ?Medication Sig Start Date End Date Taking? Authorizing Provider  ?clindamycin (CLEOCIN) 150 MG capsule Take 1 capsule (150 mg total) by mouth 4 (four) times daily for 7 days. 03/27/21 04/03/21 Yes Jahred Tatar L, PA  ?erythromycin ophthalmic ointment Place a 1/2 inch ribbon of ointment into the left lower eyelid 3-6x/ day x 5 days. 03/27/21  Yes Krystle Polcyn L, PA  ?Adalimumab (HUMIRA [place]) Inject into the skin.  02/25/14  [provider]  ?clonazePAM (KLONOPIN) 0.5 MG tablet Take 0.5 mg by mouth at bedtime as needed (migraine).  02/25/14  [provider]  ?Etanercept (ENBREL [place]) Inject into the skin.  02/25/14  [provider]  ? ? ?Family History ?History reviewed. No pertinent family history. ? ?Social History ?Social History  ? ?Tobacco Use  ? Smoking status: Former  ? Smokeless tobacco: Never  ?Substance Use Topics  ? Alcohol use: Yes   ?  Comment: seldom  ? Drug use: No  ? ? ? ?Allergies   ?Eggs or egg-derived products, Sulfa antibiotics, Tamiflu [oseltamivir], and Tomato ? ? ?Review of Systems ?Review of Systems  ?HENT:  Positive for voice change.   ?Eyes:  Positive for discharge and itching.  ? ? ?Physical Exam ?Triage Vital Signs ?ED Triage Vitals  ?Enc Vitals Group  ?   BP 03/27/21 1948 (!) 137/94  ?   Pulse Rate 03/27/21 1948 87  ?   Resp 03/27/21 1948 16  ?   Temp 03/27/21 1948 98.4 ?F (36.9 ?C)  ?   Temp Source 03/27/21 1948 Oral  ?   SpO2 03/27/21 1948 96 %  ?   Weight --   ?   Height --   ?   Head Circumference --   ?   Peak Flow --   ?   Pain Score 03/27/21 1956 6  ?   Pain Loc --   ?   Pain Edu? --   ?   Excl. in GC? --   ? ?No data found. ? ?Updated Vital Signs ?BP (!) 137/94 (BP Location: Left Arm)   Pulse 87   Temp 98.4 ?F (36.9 ?C) (Oral)   Resp 16   SpO2 96%  ? ?Visual Acuity ?Right Eye Distance:   ?  Left Eye Distance:   ?Bilateral Distance:   ? ?Right Eye Near:   ?Left Eye Near:    ?Bilateral Near:    ? ?Physical Exam ?Vitals and nursing note reviewed. Exam conducted with a chaperone present.  ?Constitutional:   ?   General: She is not in acute distress. ?   Appearance: Normal appearance. She is well-developed and normal weight. She is not ill-appearing, toxic-appearing or diaphoretic.  ?HENT:  ?   Head: Normocephalic and atraumatic.  ?   Right Ear: Tympanic membrane, ear canal and external ear normal. There is no impacted cerumen.  ?   Left Ear: Tympanic membrane, ear canal and external ear normal. There is no impacted cerumen.  ?   Nose: Nose normal. No congestion or rhinorrhea.  ?   Mouth/Throat:  ?   Mouth: Mucous membranes are moist.  ?   Pharynx: Oropharynx is clear. No oropharyngeal exudate or posterior oropharyngeal erythema.  ?Eyes:  ?   General: No scleral icterus.    ?   Right eye: No discharge.     ?   Left eye: Discharge present. ?   Extraocular Movements: Extraocular movements intact.  ?   Conjunctiva/sclera:  Conjunctivae normal.  ?   Pupils: Pupils are equal, round, and reactive to light.  ?   Comments: Swollen medial lid margin on L lower lid with two -three separate blisters with greenish yellow drainage. EOMI. No pain with palpation of tear duct or lacrimal apparatus.  ?Cardiovascular:  ?   Rate and Rhythm: Normal rate and regular rhythm.  ?   Pulses: Normal pulses.  ?   Heart sounds: No murmur heard. ?Pulmonary:  ?   Effort: Pulmonary effort is normal. No respiratory distress.  ?   Breath sounds: Normal breath sounds. No stridor. No wheezing, rhonchi or rales.  ?Chest:  ?   Chest wall: No tenderness.  ?Abdominal:  ?   Palpations: Abdomen is soft.  ?   Tenderness: There is no abdominal tenderness.  ?Musculoskeletal:     ?   General: No swelling.  ?   Cervical back: Normal range of motion and neck supple. No rigidity or tenderness.  ?Lymphadenopathy:  ?   Cervical: Cervical adenopathy (L anterior cervical chain only) present.  ?Skin: ?   General: Skin is warm and dry.  ?   Capillary Refill: Capillary refill takes less than 2 seconds.  ?   Coloration: Skin is not jaundiced.  ?   Findings: No bruising or erythema.  ?Neurological:  ?   General: No focal deficit present.  ?   Mental Status: She is alert.  ?Psychiatric:     ?   Mood and Affect: Mood normal.  ? ? ? ?UC Treatments / Results  ?Labs ?(all labs ordered are listed, but only abnormal results are displayed) ?Labs Reviewed  ?POCT RAPID STREP A (OFFICE)  ? ? ?EKG ? ? ?Radiology ?No results found. ? ?Procedures ?Procedures (including critical care time) ? ?Medications Ordered in UC ?Medications - No data to display ? ?Initial Impression / Assessment and Plan / UC Course  ?I have reviewed the triage vital signs and the nursing notes. ? ?Pertinent labs & imaging results that were available during my care of the patient were reviewed by me and considered in my medical decision making (see chart for details). ? ?  ? ?Bacterial infection of L lower eye lid - developing  infection noted to L medial lower lid. Warm compresses, johnson and johnson baby shampoo,  topical emycin eye ointment and PO clinda. This should cover for concerns for MRSA. Wash hands frequently. ?Laryngitis - supportive measures. No indication for PO steroids. Voice rest and hydration. ? ?Final Clinical Impressions(s) / UC Diagnoses  ? ?Final diagnoses:  ?Bacterial infection of eye  ?Laryngitis, acute  ? ? ? ?Discharge Instructions   ? ?  ?Please continue using warm compresses to the eye. You can also put Laural Benes and [place] baby shampoo on a warm washcloth the help cleanse the eye. ?Please take the oral antibiotic as prescribed, you may want to take an OTC probiotic or yogurt daily to help prevent diarrhea from the medication. ?Use the eye ointment 3-6x daily. ?Please follow up with ophthalmologist if symptoms persist or worsen. ?Your hoarse voice is due to laryngitis, which is usually viral. Voice rest, honey, warm tea and humidification may help. ? ? ? ? ?ED Prescriptions   ? ? Medication Sig Dispense Auth. Provider  ? clindamycin (CLEOCIN) 150 MG capsule Take 1 capsule (150 mg total) by mouth 4 (four) times daily for 7 days. 28 capsule Kedron Uno L, PA  ? erythromycin ophthalmic ointment Place a 1/2 inch ribbon of ointment into the left lower eyelid 3-6x/ day x 5 days. 3.5 g Aulden Calise L, PA  ? ?  ? ?PDMP not reviewed this encounter. ?  Maretta Bees, [place] ?03/27/21 2030 ? ?

## 2021-03-27 NOTE — Discharge Instructions (Addendum)
Please continue using warm compresses to the eye. You can also put Wynetta Emery and [place] baby shampoo on a warm washcloth the help cleanse the eye. ?Please take the oral antibiotic as prescribed, you may want to take an OTC probiotic or yogurt daily to help prevent diarrhea from the medication. ?Use the eye ointment 3-6x daily. ?Please follow up with ophthalmologist if symptoms persist or worsen. ?Your hoarse voice is due to laryngitis, which is usually viral. Voice rest, honey, warm tea and humidification may help. ? ?

## 2021-03-27 NOTE — ED Triage Notes (Signed)
Pt here with left eye bumps, drainage and pain x 2 days. Pt also c/o hoarseness.  ?

## 2021-09-15 ENCOUNTER — Emergency Department (HOSPITAL_COMMUNITY): Payer: Medicaid Other

## 2021-09-15 ENCOUNTER — Encounter (HOSPITAL_COMMUNITY): Payer: Self-pay

## 2021-09-15 ENCOUNTER — Emergency Department (HOSPITAL_COMMUNITY)
Admission: EM | Admit: 2021-09-15 | Discharge: 2021-09-15 | Disposition: A | Discharge status: Home / Self Care | Payer: Medicaid Other | Attending: Emergency Medicine | Admitting: Emergency Medicine

## 2021-09-15 ENCOUNTER — Other Ambulatory Visit: Payer: Self-pay

## 2021-09-15 DIAGNOSIS — M25512 Pain in left shoulder: Secondary | ICD-10-CM | POA: Insufficient documentation

## 2021-09-15 MED ORDER — LIDOCAINE 5 % EX PTCH
1.0000 | MEDICATED_PATCH | CUTANEOUS | Status: DC
  Administered 2021-09-15: 1 via TRANSDERMAL
  Filled 2021-09-15: qty 1

## 2021-09-15 MED ORDER — OXYCODONE-ACETAMINOPHEN 5-325 MG PO TABS
1.0000 | ORAL_TABLET | Freq: Once | ORAL | Status: AC
  Administered 2021-09-15: 1 via ORAL
  Filled 2021-09-15: qty 1

## 2021-09-15 MED ORDER — METHOCARBAMOL 500 MG PO TABS: 500.0000 mg | ORAL_TABLET | Freq: Two times a day (BID) | ORAL | 0 refills | Status: AC

## 2021-09-15 MED ORDER — LIDOCAINE 5 % EX PTCH: 1.0000 | MEDICATED_PATCH | CUTANEOUS | 0 refills | Status: AC

## 2021-09-15 MED ORDER — KETOROLAC TROMETHAMINE 60 MG/2ML IM SOLN
60.0000 mg | Freq: Once | INTRAMUSCULAR | Status: AC
Start: 2021-09-15 — End: 2021-09-15
  Administered 2021-09-15: 60 mg via INTRAMUSCULAR
  Filled 2021-09-15: qty 2

## 2021-09-15 NOTE — ED Notes (Signed)
Pt teaching provided on medications that may cause drowsiness. Pt instructed not to drive or operate heavy machinery while taking the prescribed medication. Pt verbalized understanding.   Pt provided discharge instructions and prescription information. Pt was given the opportunity to ask questions and questions were answered.   

## 2021-09-15 NOTE — Discharge Instructions (Signed)
You were seen for left shoulder pain.  Your x-ray was negative for dislocation or fracture.  I do not think you have a rotator cuff injury.  Take Tylenol 1 g every 6-8 hours and ibuprofen 600 mg every 4-6 hours.  Continue to do gentle exercises.  You can take the Robaxin as needed for muscle spasms and use lidocaine patches as needed for pain control.  If any severe worsening pain, inability to move the arm, weakness, loss of sensation, either come back to the ER, follow-up with your PCP or follow-up with orthopedics in outpatient setting.
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
## 2021-09-15 NOTE — ED Triage Notes (Signed)
Pt arrived POV from home c/o left shoulder pain after waking up this morning and attempting to put her weight on her left arm and the patient felt a pop and now cannot move her left arm. Pt states she has dislocated the same shoulder in the past.

## 2021-09-15 NOTE — ED Notes (Signed)
Pt taken to xray 

## 2021-12-25 DIAGNOSIS — Z419 Encounter for procedure for purposes other than remedying health state, unspecified: Secondary | ICD-10-CM | POA: Diagnosis not present
